# Patient Record
Sex: Male | Born: 1945 | Race: White | Hispanic: No | Marital: Married | Smoking: Former smoker
Health system: Southern US, Community
[De-identification: ages and names within clinical notes are randomized; demographics above are authoritative.]

## PROBLEM LIST (undated history)

## (undated) DIAGNOSIS — J449 Chronic obstructive pulmonary disease, unspecified: Secondary | ICD-10-CM

## (undated) DIAGNOSIS — E119 Type 2 diabetes mellitus without complications: Secondary | ICD-10-CM

## (undated) DIAGNOSIS — E785 Hyperlipidemia, unspecified: Secondary | ICD-10-CM

## (undated) DIAGNOSIS — I2510 Atherosclerotic heart disease of native coronary artery without angina pectoris: Secondary | ICD-10-CM

## (undated) DIAGNOSIS — I10 Essential (primary) hypertension: Secondary | ICD-10-CM

## (undated) DIAGNOSIS — M1990 Unspecified osteoarthritis, unspecified site: Secondary | ICD-10-CM

## (undated) DIAGNOSIS — N189 Chronic kidney disease, unspecified: Secondary | ICD-10-CM

## (undated) DIAGNOSIS — I219 Acute myocardial infarction, unspecified: Secondary | ICD-10-CM

## (undated) DIAGNOSIS — I509 Heart failure, unspecified: Secondary | ICD-10-CM

## (undated) DIAGNOSIS — I739 Peripheral vascular disease, unspecified: Secondary | ICD-10-CM

## (undated) HISTORY — DX: Chronic kidney disease, unspecified: N18.9

## (undated) HISTORY — DX: Essential (primary) hypertension: I10

## (undated) HISTORY — DX: Acute myocardial infarction, unspecified: I21.9

## (undated) HISTORY — DX: Hyperlipidemia, unspecified: E78.5

## (undated) HISTORY — DX: Chronic obstructive pulmonary disease, unspecified: J44.9

## (undated) HISTORY — DX: Heart failure, unspecified: I50.9

## (undated) HISTORY — DX: Peripheral vascular disease, unspecified: I73.9

## (undated) HISTORY — DX: Atherosclerotic heart disease of native coronary artery without angina pectoris: I25.10

## (undated) HISTORY — DX: Unspecified osteoarthritis, unspecified site: M19.90

## (undated) HISTORY — DX: Type 2 diabetes mellitus without complications: E11.9

---

## 1996-09-19 HISTORY — PX: FRACTURE SURGERY: SHX138

## 1998-09-19 DIAGNOSIS — I219 Acute myocardial infarction, unspecified: Secondary | ICD-10-CM

## 1998-09-19 HISTORY — DX: Acute myocardial infarction, unspecified: I21.9

## 2003-09-20 HISTORY — PX: CORONARY ARTERY BYPASS GRAFT: SHX141

## 2003-09-20 HISTORY — PX: JOINT REPLACEMENT: SHX530

## 2005-12-29 ENCOUNTER — Other Ambulatory Visit: Payer: Self-pay

## 2005-12-29 ENCOUNTER — Inpatient Hospital Stay: Payer: Self-pay | Admitting: Internal Medicine

## 2005-12-30 ENCOUNTER — Other Ambulatory Visit: Payer: Self-pay

## 2008-02-17 ENCOUNTER — Emergency Department: Payer: Self-pay | Admitting: Emergency Medicine

## 2009-11-13 ENCOUNTER — Emergency Department: Payer: Self-pay | Admitting: Emergency Medicine

## 2012-08-09 ENCOUNTER — Encounter: Payer: Self-pay | Admitting: Cardiovascular Disease

## 2012-08-19 ENCOUNTER — Encounter: Payer: Self-pay | Admitting: Cardiovascular Disease

## 2012-09-19 ENCOUNTER — Encounter: Payer: Self-pay | Admitting: Cardiovascular Disease

## 2012-10-20 ENCOUNTER — Encounter: Payer: Self-pay | Admitting: Cardiovascular Disease

## 2012-11-17 ENCOUNTER — Encounter: Payer: Self-pay | Admitting: Cardiovascular Disease

## 2012-12-18 ENCOUNTER — Encounter: Payer: Self-pay | Admitting: Cardiovascular Disease

## 2014-11-18 HISTORY — PX: CARDIAC DEFIBRILLATOR PLACEMENT: SHX171

## 2015-02-09 ENCOUNTER — Ambulatory Visit: Payer: Self-pay | Admitting: *Deleted

## 2015-02-18 ENCOUNTER — Ambulatory Visit: Payer: Self-pay | Admitting: *Deleted

## 2015-02-23 ENCOUNTER — Ambulatory Visit: Payer: Self-pay | Admitting: *Deleted

## 2015-03-13 ENCOUNTER — Encounter: Payer: Medicare Other | Attending: Family Medicine | Admitting: *Deleted

## 2015-03-13 ENCOUNTER — Encounter: Payer: Self-pay | Admitting: *Deleted

## 2015-03-13 VITALS — BP 150/80 | Ht 72.0 in | Wt 304.1 lb

## 2015-03-13 DIAGNOSIS — E119 Type 2 diabetes mellitus without complications: Secondary | ICD-10-CM | POA: Diagnosis present

## 2015-03-13 DIAGNOSIS — E1122 Type 2 diabetes mellitus with diabetic chronic kidney disease: Secondary | ICD-10-CM

## 2015-03-13 NOTE — Patient Instructions (Signed)
Check blood sugars 2 x day before breakfast and 2 hrs after supper every day  Eat 3 meals day,  1-2 snacks a day Space meals 4-6 hours apart Limit desserts/sweets and foods high in fat  Bring blood sugar records to the next appointment/class  Carry fast acting glucose and a snack at all times  Return for appointment/classes on:

## 2015-03-13 NOTE — Progress Notes (Signed)
Diabetes Self-Management Education  Visit Type: First/Initial  Appt. Start Time: 0915 Appt. End Time: 1030  03/13/2015  Mr. Brent Silva, identified by name and date of birth, is a 69 y.o. male with a diagnosis of Diabetes: Type 2.    ASSESSMENT Blood pressure 150/80, height 6' (1.829 m), weight 304 lb 1.6 oz (137.939 kg). Body mass index is 41.23 kg/(m^2).  Initial Visit Information: Are you currently following a meal plan?: No Are you taking your medications as prescribed?: Yes Are you checking your feet?: Yes How many days per week are you checking your feet?: 7 How often do you need to have someone help you when you read instructions, pamphlets, or other written materials from your doctor or pharmacy?: 1 - Never What is the last grade level you completed in school?: 12th  Psychosocial: Patient Belief/Attitude about Diabetes: Other ("I don't dwell on it") Self-care barriers: None Self-management support: Family, Doctor's office Patient Concerns: Medication, Monitoring, Healthy Lifestyle, Problem Solving, Glycemic Control, Weight Control Special Needs: None Preferred Learning Style: Visual, Hands on Learning Readiness: Contemplating ("maybe" when asked about making changes)  Complications:  Last HgB A1C per patient/outside source: 8.5 % (01/21/15) How often do you check your blood sugar?: 1-2 times/day Fasting Blood glucose range (mg/dL): >643 (ranges from 329-518 mg/dL Postprandial Blood glucose range (mg/dL): >841 (bedtime readings range from 238-413 mg/dL) Higher readings have occurred since switching from 70/30 to Lantus.  Have you had a dilated eye exam in the past 12 months?: Yes Have you had a dental exam in the past 12 months?: No (longer than 5 yrs ago)  Diet Intake: Breakfast: eggs, sausage or bacon, toast, fruit Snack (morning): fruit Lunch: fries, pickles, banana sandwich Dinner: meat, potatoes, green beans, salad Snack (evening): ice cream Beverage(s): water,  diet soda, coffee  Exercise: Exercise: ADL's  Individualized Plan for Diabetes Self-Management Training:  Learning Objective:  Patient will have a greater understanding of diabetes self-management.  Education Topics Reviewed with Patient Today: Explored patient's options for treatment of their diabetes Reviewed blood glucose goals for pre and post meals and how to evaluate the patients' food intake on their blood glucose level., Meal timing in regards to the patients' current diabetes medication. Role of exercise on diabetes management, blood pressure control and cardiac health. Taught/reviewed insulin injection, site rotation, insulin storage and needle disposal., Reviewed patients medication for diabetes, action, purpose, timing of dose and side effects. Identified appropriate SMBG and/or A1C goals. Taught treatment of hypoglycemia - the 15 rule. Relationship between chronic complications and blood glucose control, Identified and discussed with patient  current chronic complications  PATIENTS GOALS/Plan (Developed by the patient): Improve blood sugars Decrease medications Prevent diabetes complications Lose weight Lead a healthier lifestyle Become more fit  Plan:   Patient Instructions  Check blood sugars 2 x day before breakfast and 2 hrs after supper every day Eat 3 meals day,  1-2 snacks a day Space meals 4-6 hours apart Limit desserts/sweets and foods high in fat Bring blood sugar records to the next appointment/class Carry fast acting glucose and a snack at all times  Expected Outcomes:  Other (Demonstrated some interest in learning but not ready for many changes.)  Education material provided:  General Meal Planning Guidelines Symptoms, causes and treatments of Hypoglycemia  If problems or questions, patient to contact team via:   Sharion Settler, RN, CCM, CDE [phone]  Future DSME appointment:  March 30, 2015 for Class 1

## 2015-03-30 ENCOUNTER — Encounter: Payer: Medicare Other | Attending: Family Medicine | Admitting: Dietician

## 2015-03-30 VITALS — Ht 72.0 in | Wt 302.8 lb

## 2015-03-30 DIAGNOSIS — E119 Type 2 diabetes mellitus without complications: Secondary | ICD-10-CM | POA: Diagnosis not present

## 2015-03-30 NOTE — Progress Notes (Signed)

## 2015-04-06 ENCOUNTER — Encounter: Payer: Medicare Other | Admitting: Dietician

## 2015-04-06 ENCOUNTER — Encounter: Payer: Self-pay | Admitting: Dietician

## 2015-04-06 VITALS — Wt 301.5 lb

## 2015-04-06 DIAGNOSIS — E119 Type 2 diabetes mellitus without complications: Secondary | ICD-10-CM

## 2015-04-06 NOTE — Progress Notes (Signed)

## 2015-04-13 ENCOUNTER — Encounter: Payer: Self-pay | Admitting: Dietician

## 2015-04-13 NOTE — Progress Notes (Signed)
Pt did not come to class 3 on 04-13-15. Called pt-pt states"I forgot". Rescheduled class 3 on 05-11-15

## 2015-05-22 ENCOUNTER — Encounter: Payer: Self-pay | Admitting: *Deleted

## 2016-08-30 ENCOUNTER — Inpatient Hospital Stay
Admission: EM | Admit: 2016-08-30 | Discharge: 2016-09-04 | DRG: 871 | Disposition: A | Discharge status: Home / Self Care | Payer: Medicare Other | Attending: Specialist | Admitting: Specialist

## 2016-08-30 DIAGNOSIS — J449 Chronic obstructive pulmonary disease, unspecified: Secondary | ICD-10-CM | POA: Diagnosis present

## 2016-08-30 DIAGNOSIS — E162 Hypoglycemia, unspecified: Secondary | ICD-10-CM | POA: Diagnosis present

## 2016-08-30 DIAGNOSIS — Z882 Allergy status to sulfonamides status: Secondary | ICD-10-CM

## 2016-08-30 DIAGNOSIS — R6521 Severe sepsis with septic shock: Secondary | ICD-10-CM | POA: Diagnosis present

## 2016-08-30 DIAGNOSIS — I2510 Atherosclerotic heart disease of native coronary artery without angina pectoris: Secondary | ICD-10-CM | POA: Diagnosis present

## 2016-08-30 DIAGNOSIS — R2681 Unsteadiness on feet: Secondary | ICD-10-CM

## 2016-08-30 DIAGNOSIS — Z79899 Other long term (current) drug therapy: Secondary | ICD-10-CM

## 2016-08-30 DIAGNOSIS — E785 Hyperlipidemia, unspecified: Secondary | ICD-10-CM | POA: Diagnosis present

## 2016-08-30 DIAGNOSIS — R0600 Dyspnea, unspecified: Secondary | ICD-10-CM

## 2016-08-30 DIAGNOSIS — Z452 Encounter for adjustment and management of vascular access device: Secondary | ICD-10-CM

## 2016-08-30 DIAGNOSIS — E119 Type 2 diabetes mellitus without complications: Secondary | ICD-10-CM

## 2016-08-30 DIAGNOSIS — R262 Difficulty in walking, not elsewhere classified: Secondary | ICD-10-CM

## 2016-08-30 DIAGNOSIS — A4151 Sepsis due to Escherichia coli [E. coli]: Secondary | ICD-10-CM | POA: Diagnosis not present

## 2016-08-30 DIAGNOSIS — I252 Old myocardial infarction: Secondary | ICD-10-CM

## 2016-08-30 DIAGNOSIS — Z95810 Presence of automatic (implantable) cardiac defibrillator: Secondary | ICD-10-CM

## 2016-08-30 DIAGNOSIS — E1140 Type 2 diabetes mellitus with diabetic neuropathy, unspecified: Secondary | ICD-10-CM | POA: Diagnosis present

## 2016-08-30 DIAGNOSIS — D65 Disseminated intravascular coagulation [defibrination syndrome]: Secondary | ICD-10-CM | POA: Diagnosis not present

## 2016-08-30 DIAGNOSIS — I130 Hypertensive heart and chronic kidney disease with heart failure and stage 1 through stage 4 chronic kidney disease, or unspecified chronic kidney disease: Secondary | ICD-10-CM | POA: Diagnosis present

## 2016-08-30 DIAGNOSIS — Z888 Allergy status to other drugs, medicaments and biological substances status: Secondary | ICD-10-CM

## 2016-08-30 DIAGNOSIS — E1151 Type 2 diabetes mellitus with diabetic peripheral angiopathy without gangrene: Secondary | ICD-10-CM | POA: Diagnosis present

## 2016-08-30 DIAGNOSIS — K8309 Other cholangitis: Secondary | ICD-10-CM

## 2016-08-30 DIAGNOSIS — N184 Chronic kidney disease, stage 4 (severe): Secondary | ICD-10-CM | POA: Diagnosis present

## 2016-08-30 DIAGNOSIS — Z7951 Long term (current) use of inhaled steroids: Secondary | ICD-10-CM

## 2016-08-30 DIAGNOSIS — A419 Sepsis, unspecified organism: Secondary | ICD-10-CM

## 2016-08-30 DIAGNOSIS — E669 Obesity, unspecified: Secondary | ICD-10-CM | POA: Diagnosis present

## 2016-08-30 DIAGNOSIS — E872 Acidosis: Secondary | ICD-10-CM | POA: Diagnosis present

## 2016-08-30 DIAGNOSIS — K830 Cholangitis: Secondary | ICD-10-CM | POA: Diagnosis present

## 2016-08-30 DIAGNOSIS — R945 Abnormal results of liver function studies: Secondary | ICD-10-CM | POA: Diagnosis present

## 2016-08-30 DIAGNOSIS — Z794 Long term (current) use of insulin: Secondary | ICD-10-CM

## 2016-08-30 DIAGNOSIS — R109 Unspecified abdominal pain: Secondary | ICD-10-CM

## 2016-08-30 DIAGNOSIS — N2889 Other specified disorders of kidney and ureter: Secondary | ICD-10-CM | POA: Diagnosis present

## 2016-08-30 DIAGNOSIS — E875 Hyperkalemia: Secondary | ICD-10-CM | POA: Diagnosis present

## 2016-08-30 DIAGNOSIS — E1122 Type 2 diabetes mellitus with diabetic chronic kidney disease: Secondary | ICD-10-CM | POA: Diagnosis present

## 2016-08-30 DIAGNOSIS — R748 Abnormal levels of other serum enzymes: Secondary | ICD-10-CM | POA: Diagnosis present

## 2016-08-30 DIAGNOSIS — N200 Calculus of kidney: Secondary | ICD-10-CM | POA: Diagnosis present

## 2016-08-30 DIAGNOSIS — G9340 Encephalopathy, unspecified: Secondary | ICD-10-CM | POA: Diagnosis present

## 2016-08-30 DIAGNOSIS — Z87891 Personal history of nicotine dependence: Secondary | ICD-10-CM

## 2016-08-30 DIAGNOSIS — K859 Acute pancreatitis without necrosis or infection, unspecified: Secondary | ICD-10-CM | POA: Diagnosis present

## 2016-08-30 DIAGNOSIS — Z951 Presence of aortocoronary bypass graft: Secondary | ICD-10-CM

## 2016-08-30 DIAGNOSIS — B9620 Unspecified Escherichia coli [E. coli] as the cause of diseases classified elsewhere: Secondary | ICD-10-CM | POA: Diagnosis present

## 2016-08-30 DIAGNOSIS — K219 Gastro-esophageal reflux disease without esophagitis: Secondary | ICD-10-CM | POA: Diagnosis present

## 2016-08-30 DIAGNOSIS — Z6841 Body Mass Index (BMI) 40.0 and over, adult: Secondary | ICD-10-CM

## 2016-08-30 DIAGNOSIS — N179 Acute kidney failure, unspecified: Secondary | ICD-10-CM | POA: Diagnosis present

## 2016-08-30 DIAGNOSIS — Z7982 Long term (current) use of aspirin: Secondary | ICD-10-CM

## 2016-08-30 DIAGNOSIS — R4182 Altered mental status, unspecified: Secondary | ICD-10-CM | POA: Diagnosis not present

## 2016-08-31 ENCOUNTER — Inpatient Hospital Stay: Payer: Medicare Other

## 2016-08-31 ENCOUNTER — Emergency Department: Payer: Medicare Other

## 2016-08-31 ENCOUNTER — Encounter: Payer: Self-pay | Admitting: Internal Medicine

## 2016-08-31 DIAGNOSIS — K830 Cholangitis: Secondary | ICD-10-CM | POA: Diagnosis present

## 2016-08-31 DIAGNOSIS — N200 Calculus of kidney: Secondary | ICD-10-CM | POA: Diagnosis present

## 2016-08-31 DIAGNOSIS — J449 Chronic obstructive pulmonary disease, unspecified: Secondary | ICD-10-CM | POA: Diagnosis present

## 2016-08-31 DIAGNOSIS — E785 Hyperlipidemia, unspecified: Secondary | ICD-10-CM | POA: Diagnosis present

## 2016-08-31 DIAGNOSIS — R6521 Severe sepsis with septic shock: Secondary | ICD-10-CM | POA: Diagnosis present

## 2016-08-31 DIAGNOSIS — I2510 Atherosclerotic heart disease of native coronary artery without angina pectoris: Secondary | ICD-10-CM | POA: Diagnosis present

## 2016-08-31 DIAGNOSIS — E875 Hyperkalemia: Secondary | ICD-10-CM | POA: Diagnosis present

## 2016-08-31 DIAGNOSIS — K859 Acute pancreatitis without necrosis or infection, unspecified: Secondary | ICD-10-CM | POA: Diagnosis present

## 2016-08-31 DIAGNOSIS — R599 Enlarged lymph nodes, unspecified: Secondary | ICD-10-CM

## 2016-08-31 DIAGNOSIS — R571 Hypovolemic shock: Secondary | ICD-10-CM | POA: Diagnosis not present

## 2016-08-31 DIAGNOSIS — G9340 Encephalopathy, unspecified: Secondary | ICD-10-CM | POA: Diagnosis present

## 2016-08-31 DIAGNOSIS — R945 Abnormal results of liver function studies: Secondary | ICD-10-CM | POA: Diagnosis present

## 2016-08-31 DIAGNOSIS — A4151 Sepsis due to Escherichia coli [E. coli]: Secondary | ICD-10-CM | POA: Diagnosis present

## 2016-08-31 DIAGNOSIS — I130 Hypertensive heart and chronic kidney disease with heart failure and stage 1 through stage 4 chronic kidney disease, or unspecified chronic kidney disease: Secondary | ICD-10-CM | POA: Diagnosis present

## 2016-08-31 DIAGNOSIS — Z6841 Body Mass Index (BMI) 40.0 and over, adult: Secondary | ICD-10-CM | POA: Diagnosis not present

## 2016-08-31 DIAGNOSIS — K219 Gastro-esophageal reflux disease without esophagitis: Secondary | ICD-10-CM | POA: Diagnosis present

## 2016-08-31 DIAGNOSIS — N2889 Other specified disorders of kidney and ureter: Secondary | ICD-10-CM | POA: Diagnosis present

## 2016-08-31 DIAGNOSIS — E669 Obesity, unspecified: Secondary | ICD-10-CM | POA: Diagnosis present

## 2016-08-31 DIAGNOSIS — A419 Sepsis, unspecified organism: Secondary | ICD-10-CM

## 2016-08-31 DIAGNOSIS — R748 Abnormal levels of other serum enzymes: Secondary | ICD-10-CM | POA: Diagnosis present

## 2016-08-31 DIAGNOSIS — R4182 Altered mental status, unspecified: Secondary | ICD-10-CM | POA: Diagnosis present

## 2016-08-31 DIAGNOSIS — E1122 Type 2 diabetes mellitus with diabetic chronic kidney disease: Secondary | ICD-10-CM | POA: Diagnosis present

## 2016-08-31 DIAGNOSIS — E162 Hypoglycemia, unspecified: Secondary | ICD-10-CM | POA: Diagnosis present

## 2016-08-31 DIAGNOSIS — N184 Chronic kidney disease, stage 4 (severe): Secondary | ICD-10-CM | POA: Diagnosis present

## 2016-08-31 DIAGNOSIS — N179 Acute kidney failure, unspecified: Secondary | ICD-10-CM | POA: Diagnosis present

## 2016-08-31 DIAGNOSIS — E1151 Type 2 diabetes mellitus with diabetic peripheral angiopathy without gangrene: Secondary | ICD-10-CM | POA: Diagnosis present

## 2016-08-31 DIAGNOSIS — E872 Acidosis: Secondary | ICD-10-CM | POA: Diagnosis present

## 2016-08-31 DIAGNOSIS — D65 Disseminated intravascular coagulation [defibrination syndrome]: Secondary | ICD-10-CM | POA: Diagnosis not present

## 2016-08-31 LAB — COMPREHENSIVE METABOLIC PANEL
ALBUMIN: 3.6 g/dL (ref 3.5–5.0)
ALK PHOS: 194 U/L — AB (ref 38–126)
ALT: 69 U/L — AB (ref 17–63)
ALT: 87 U/L — AB (ref 17–63)
ALT: 87 U/L — ABNORMAL HIGH (ref 17–63)
ANION GAP: 6 (ref 5–15)
AST: 133 U/L — ABNORMAL HIGH (ref 15–41)
AST: 109 U/L — ABNORMAL HIGH (ref 15–41)
AST: 65 U/L — AB (ref 15–41)
Albumin: 4.1 g/dL (ref 3.5–5.0)
Albumin: 3.3 g/dL — ABNORMAL LOW (ref 3.5–5.0)
Alkaline Phosphatase: 232 U/L — ABNORMAL HIGH (ref 38–126)
Alkaline Phosphatase: 162 U/L — ABNORMAL HIGH (ref 38–126)
Anion gap: 7 (ref 5–15)
Anion gap: 6 (ref 5–15)
BILIRUBIN TOTAL: 3.8 mg/dL — AB (ref 0.3–1.2)
BUN: 45 mg/dL — ABNORMAL HIGH (ref 6–20)
BUN: 46 mg/dL — AB (ref 6–20)
BUN: 49 mg/dL — AB (ref 6–20)
CALCIUM: 8.1 mg/dL — AB (ref 8.9–10.3)
CHLORIDE: 105 mmol/L (ref 101–111)
CHLORIDE: 105 mmol/L (ref 101–111)
CO2: 23 mmol/L (ref 22–32)
CO2: 24 mmol/L (ref 22–32)
CO2: 26 mmol/L (ref 22–32)
CREATININE: 3.95 mg/dL — AB (ref 0.61–1.24)
Calcium: 8.9 mg/dL (ref 8.9–10.3)
Calcium: 7.8 mg/dL — ABNORMAL LOW (ref 8.9–10.3)
Chloride: 106 mmol/L (ref 101–111)
Creatinine, Ser: 3.04 mg/dL — ABNORMAL HIGH (ref 0.61–1.24)
Creatinine, Ser: 3.5 mg/dL — ABNORMAL HIGH (ref 0.61–1.24)
GFR calc Af Amer: 19 mL/min — ABNORMAL LOW (ref >60)
GFR calc Af Amer: 16 mL/min — ABNORMAL LOW (ref >60)
GFR calc non Af Amer: 19 mL/min — ABNORMAL LOW (ref >60)
GFR calc non Af Amer: 14 mL/min — ABNORMAL LOW (ref >60)
GFR, EST AFRICAN AMERICAN: 22 mL/min — AB (ref >60)
GFR, EST NON AFRICAN AMERICAN: 16 mL/min — AB (ref >60)
GLUCOSE: 108 mg/dL — AB (ref 65–99)
Glucose, Bld: 153 mg/dL — ABNORMAL HIGH (ref 65–99)
Glucose, Bld: 255 mg/dL — ABNORMAL HIGH (ref 65–99)
POTASSIUM: 5.3 mmol/L — AB (ref 3.5–5.1)
Potassium: 5.2 mmol/L — ABNORMAL HIGH (ref 3.5–5.1)
Potassium: 4.1 mmol/L (ref 3.5–5.1)
SODIUM: 135 mmol/L (ref 135–145)
SODIUM: 137 mmol/L (ref 135–145)
Sodium: 136 mmol/L (ref 135–145)
TOTAL PROTEIN: 6.4 g/dL — AB (ref 6.5–8.1)
Total Bilirubin: 3.5 mg/dL — ABNORMAL HIGH (ref 0.3–1.2)
Total Bilirubin: 2.3 mg/dL — ABNORMAL HIGH (ref 0.3–1.2)
Total Protein: 7.3 g/dL (ref 6.5–8.1)
Total Protein: 6 g/dL — ABNORMAL LOW (ref 6.5–8.1)

## 2016-08-31 LAB — URINALYSIS, COMPLETE (UACMP) WITH MICROSCOPIC
Bilirubin Urine: NEGATIVE
GLUCOSE, UA: 50 mg/dL — AB
Hgb urine dipstick: NEGATIVE
Ketones, ur: NEGATIVE mg/dL
Leukocytes, UA: NEGATIVE
NITRITE: NEGATIVE
PH: 5 (ref 5.0–8.0)
Protein, ur: 30 mg/dL — AB
Specific Gravity, Urine: 1.02 (ref 1.005–1.030)

## 2016-08-31 LAB — LIPASE, BLOOD
LIPASE: 1367 U/L — AB (ref 11–51)
Lipase: 1690 U/L — ABNORMAL HIGH (ref 11–51)

## 2016-08-31 LAB — CBC
HEMATOCRIT: 37.5 % — AB (ref 40.0–52.0)
HEMOGLOBIN: 12.8 g/dL — AB (ref 13.0–18.0)
MCH: 32.5 pg (ref 26.0–34.0)
MCHC: 34.2 g/dL (ref 32.0–36.0)
MCV: 94.9 fL (ref 80.0–100.0)
Platelets: 92 10*3/uL — ABNORMAL LOW (ref 150–440)
RBC: 3.95 MIL/uL — ABNORMAL LOW (ref 4.40–5.90)
RDW: 14.2 % (ref 11.5–14.5)
WBC: 9.4 10*3/uL (ref 3.8–10.6)

## 2016-08-31 LAB — LACTIC ACID, PLASMA
LACTIC ACID, VENOUS: 1.2 mmol/L (ref 0.5–1.9)
Lactic Acid, Venous: 1.9 mmol/L (ref 0.5–1.9)
Lactic Acid, Venous: 2 mmol/L (ref 0.5–1.9)

## 2016-08-31 LAB — BLOOD CULTURE ID PANEL (REFLEXED)
Acinetobacter baumannii: NOT DETECTED
CANDIDA KRUSEI: NOT DETECTED
CANDIDA PARAPSILOSIS: NOT DETECTED
CANDIDA TROPICALIS: NOT DETECTED
CARBAPENEM RESISTANCE: NOT DETECTED
Candida albicans: NOT DETECTED
Candida glabrata: NOT DETECTED
Enterobacter cloacae complex: NOT DETECTED
Enterobacteriaceae species: DETECTED — AB
Enterococcus species: NOT DETECTED
Escherichia coli: DETECTED — AB
Haemophilus influenzae: NOT DETECTED
KLEBSIELLA OXYTOCA: NOT DETECTED
KLEBSIELLA PNEUMONIAE: NOT DETECTED
Listeria monocytogenes: NOT DETECTED
Neisseria meningitidis: NOT DETECTED
PROTEUS SPECIES: NOT DETECTED
PSEUDOMONAS AERUGINOSA: NOT DETECTED
SERRATIA MARCESCENS: NOT DETECTED
STAPHYLOCOCCUS AUREUS BCID: NOT DETECTED
STAPHYLOCOCCUS SPECIES: NOT DETECTED
STREPTOCOCCUS PYOGENES: NOT DETECTED
Streptococcus agalactiae: NOT DETECTED
Streptococcus pneumoniae: NOT DETECTED
Streptococcus species: NOT DETECTED

## 2016-08-31 LAB — GLUCOSE, CAPILLARY
GLUCOSE-CAPILLARY: 49 mg/dL — AB (ref 65–99)
GLUCOSE-CAPILLARY: 120 mg/dL — AB (ref 65–99)
GLUCOSE-CAPILLARY: 79 mg/dL (ref 65–99)
Glucose-Capillary: 102 mg/dL — ABNORMAL HIGH (ref 65–99)
Glucose-Capillary: 137 mg/dL — ABNORMAL HIGH (ref 65–99)
Glucose-Capillary: 32 mg/dL — CL (ref 65–99)
Glucose-Capillary: 33 mg/dL — CL (ref 65–99)
Glucose-Capillary: 60 mg/dL — ABNORMAL LOW (ref 65–99)
Glucose-Capillary: 68 mg/dL (ref 65–99)
Glucose-Capillary: 76 mg/dL (ref 65–99)
Glucose-Capillary: 76 mg/dL (ref 65–99)
Glucose-Capillary: 84 mg/dL (ref 65–99)

## 2016-08-31 LAB — BASIC METABOLIC PANEL
ANION GAP: 6 (ref 5–15)
BUN: 52 mg/dL — ABNORMAL HIGH (ref 6–20)
CO2: 24 mmol/L (ref 22–32)
CREATININE: 3.99 mg/dL — AB (ref 0.61–1.24)
Calcium: 7.7 mg/dL — ABNORMAL LOW (ref 8.9–10.3)
Chloride: 105 mmol/L (ref 101–111)
GFR calc non Af Amer: 14 mL/min — ABNORMAL LOW (ref >60)
GFR, EST AFRICAN AMERICAN: 16 mL/min — AB (ref >60)
Glucose, Bld: 131 mg/dL — ABNORMAL HIGH (ref 65–99)
Potassium: 5.1 mmol/L (ref 3.5–5.1)
SODIUM: 135 mmol/L (ref 135–145)

## 2016-08-31 LAB — LIPID PANEL
CHOLESTEROL: 99 mg/dL (ref 0–200)
HDL: 17 mg/dL — AB (ref >40)
LDL Cholesterol: 59 mg/dL (ref 0–99)
TRIGLYCERIDES: 113 mg/dL (ref <150)
Total CHOL/HDL Ratio: 5.8 RATIO
VLDL: 23 mg/dL (ref 0–40)

## 2016-08-31 LAB — CBC WITH DIFFERENTIAL/PLATELET
Basophils Absolute: 0 10*3/uL (ref 0–0.1)
Basophils Relative: 0 %
Eosinophils Absolute: 0.1 10*3/uL (ref 0–0.7)
Eosinophils Relative: 1 %
HEMATOCRIT: 38 % — AB (ref 40.0–52.0)
HEMOGLOBIN: 13 g/dL (ref 13.0–18.0)
LYMPHS ABS: 0.1 10*3/uL — AB (ref 1.0–3.6)
LYMPHS PCT: 2 %
MCH: 32.3 pg (ref 26.0–34.0)
MCHC: 34.2 g/dL (ref 32.0–36.0)
MCV: 94.3 fL (ref 80.0–100.0)
MONOS PCT: 3 %
Monocytes Absolute: 0.2 10*3/uL (ref 0.2–1.0)
NEUTROS ABS: 5.8 10*3/uL (ref 1.4–6.5)
NEUTROS PCT: 94 %
Platelets: 93 10*3/uL — ABNORMAL LOW (ref 150–440)
RBC: 4.03 MIL/uL — ABNORMAL LOW (ref 4.40–5.90)
RDW: 14 % (ref 11.5–14.5)
WBC: 6.2 10*3/uL (ref 3.8–10.6)

## 2016-08-31 LAB — CK: CK TOTAL: 83 U/L (ref 49–397)

## 2016-08-31 LAB — BILIRUBIN, DIRECT: Bilirubin, Direct: 1.6 mg/dL — ABNORMAL HIGH (ref 0.1–0.5)

## 2016-08-31 LAB — MRSA PCR SCREENING: MRSA BY PCR: NEGATIVE

## 2016-08-31 LAB — PROTIME-INR
INR: 1.11
PROTHROMBIN TIME: 14.3 s (ref 11.4–15.2)

## 2016-08-31 LAB — AMMONIA: AMMONIA: 42 umol/L — AB (ref 9–35)

## 2016-08-31 MED ORDER — PIPERACILLIN-TAZOBACTAM 3.375 G IVPB
3.3750 g | Freq: Three times a day (TID) | INTRAVENOUS | Status: DC
  Administered 2016-08-31: 3.375 g via INTRAVENOUS
  Filled 2016-08-31: qty 50

## 2016-08-31 MED ORDER — DEXTROSE 50 % IV SOLN
INTRAVENOUS | Status: AC
  Administered 2016-08-31: 50 mL
  Filled 2016-08-31: qty 50

## 2016-08-31 MED ORDER — ACETAMINOPHEN 650 MG RE SUPP: 650.0000 mg | Freq: Four times a day (QID) | RECTAL | Status: DC | PRN

## 2016-08-31 MED ORDER — SODIUM CHLORIDE 0.9 % IV BOLUS (SEPSIS)
1000.0000 mL | Freq: Once | INTRAVENOUS | Status: AC
  Administered 2016-08-31: 1000 mL via INTRAVENOUS

## 2016-08-31 MED ORDER — DEXTROSE 50 % IV SOLN
50.0000 mL | Freq: Once | INTRAVENOUS | Status: AC
  Administered 2016-08-31: 50 mL via INTRAVENOUS

## 2016-08-31 MED ORDER — VANCOMYCIN HCL IN DEXTROSE 1-5 GM/200ML-% IV SOLN: 1000.0000 mg | Freq: Once | INTRAVENOUS | Status: DC

## 2016-08-31 MED ORDER — DEXTROSE 50 % IV SOLN
INTRAVENOUS | Status: AC
  Administered 2016-08-31: 50 mL via INTRAVENOUS
  Filled 2016-08-31: qty 50

## 2016-08-31 MED ORDER — DEXTROSE 50 % IV SOLN
INTRAVENOUS | Status: AC
  Administered 2016-08-31: 20:00:00
  Filled 2016-08-31: qty 50

## 2016-08-31 MED ORDER — SODIUM CHLORIDE 0.9 % IV SOLN
1500.0000 mg | INTRAVENOUS | Status: DC
  Filled 2016-08-31: qty 1500

## 2016-08-31 MED ORDER — PIPERACILLIN-TAZOBACTAM 3.375 G IVPB 30 MIN: 3.3750 g | Freq: Once | INTRAVENOUS | Status: DC

## 2016-08-31 MED ORDER — NOREPINEPHRINE 4 MG/250ML-% IV SOLN
0.0000 ug/min | INTRAVENOUS | Status: DC
  Administered 2016-08-31: 2 ug/min via INTRAVENOUS

## 2016-08-31 MED ORDER — PANTOPRAZOLE SODIUM 40 MG IV SOLR
40.0000 mg | INTRAVENOUS | Status: DC
  Administered 2016-08-31 – 2016-09-04 (×5): 40 mg via INTRAVENOUS
  Filled 2016-08-31 (×5): qty 40

## 2016-08-31 MED ORDER — SODIUM CHLORIDE 0.9 % IV BOLUS (SEPSIS)
1000.0000 mL | Freq: Once | INTRAVENOUS | Status: AC
Start: 2016-08-31 — End: 2016-08-31
  Administered 2016-08-31: 1000 mL via INTRAVENOUS

## 2016-08-31 MED ORDER — PIPERACILLIN-TAZOBACTAM 3.375 G IVPB 30 MIN
3.3750 g | Freq: Once | INTRAVENOUS | Status: AC
  Administered 2016-08-31: 3.375 g via INTRAVENOUS
  Filled 2016-08-31: qty 50

## 2016-08-31 MED ORDER — ACETAMINOPHEN 650 MG RE SUPP
975.0000 mg | Freq: Once | RECTAL | Status: AC
  Administered 2016-08-31: 02:00:00 975 mg via RECTAL
  Filled 2016-08-31: qty 1

## 2016-08-31 MED ORDER — NOREPINEPHRINE 4 MG/250ML-% IV SOLN
0.0000 ug/min | INTRAVENOUS | Status: DC
  Administered 2016-08-31 – 2016-09-01 (×2): 2 ug/min via INTRAVENOUS
  Filled 2016-08-31 (×3): qty 250

## 2016-08-31 MED ORDER — DEXTROSE 5 % IV SOLN: 0.0000 ug/min | Freq: Once | INTRAVENOUS | Status: DC

## 2016-08-31 MED ORDER — ACETAMINOPHEN 325 MG PO TABS
650.0000 mg | ORAL_TABLET | Freq: Four times a day (QID) | ORAL | Status: DC | PRN
  Administered 2016-09-01 – 2016-09-03 (×5): 650 mg via ORAL
  Filled 2016-08-31 (×5): qty 2

## 2016-08-31 MED ORDER — SODIUM CHLORIDE 0.9 % IV SOLN
INTRAVENOUS | Status: DC
  Administered 2016-08-31: 18:00:00 via INTRAVENOUS

## 2016-08-31 MED ORDER — SODIUM BICARBONATE 8.4 % IV SOLN
INTRAVENOUS | Status: DC
  Administered 2016-08-31: 14:00:00 via INTRAVENOUS
  Filled 2016-08-31 (×2): qty 150

## 2016-08-31 MED ORDER — MOMETASONE FURO-FORMOTEROL FUM 200-5 MCG/ACT IN AERO
2.0000 | INHALATION_SPRAY | Freq: Two times a day (BID) | RESPIRATORY_TRACT | Status: DC
  Administered 2016-08-31 – 2016-09-04 (×9): 2 via RESPIRATORY_TRACT
  Filled 2016-08-31: qty 8.8

## 2016-08-31 MED ORDER — DEXTROSE 50 % IV SOLN
1.0000 | Freq: Once | INTRAVENOUS | Status: AC
  Administered 2016-08-31: 50 mL via INTRAVENOUS

## 2016-08-31 MED ORDER — ONDANSETRON HCL 4 MG PO TABS: 4.0000 mg | ORAL_TABLET | Freq: Four times a day (QID) | ORAL | Status: DC | PRN

## 2016-08-31 MED ORDER — HEPARIN SODIUM (PORCINE) 5000 UNIT/ML IJ SOLN
5000.0000 [IU] | Freq: Three times a day (TID) | INTRAMUSCULAR | Status: DC
  Administered 2016-08-31: 5000 [IU] via SUBCUTANEOUS
  Filled 2016-08-31: qty 1

## 2016-08-31 MED ORDER — IOPAMIDOL (ISOVUE-300) INJECTION 61%
30.0000 mL | INTRAVENOUS | Status: AC
  Administered 2016-08-31 (×2): 30 mL via ORAL

## 2016-08-31 MED ORDER — ORAL CARE MOUTH RINSE
15.0000 mL | Freq: Two times a day (BID) | OROMUCOSAL | Status: DC
Start: 2016-08-31 — End: 2016-09-04
  Administered 2016-08-31 – 2016-09-02 (×5): 15 mL via OROMUCOSAL

## 2016-08-31 MED ORDER — ALBUTEROL SULFATE (2.5 MG/3ML) 0.083% IN NEBU: 3.0000 mL | INHALATION_SOLUTION | Freq: Four times a day (QID) | RESPIRATORY_TRACT | Status: DC | PRN

## 2016-08-31 MED ORDER — SODIUM CHLORIDE 0.9 % IV SOLN
1.0000 g | Freq: Two times a day (BID) | INTRAVENOUS | Status: DC
  Administered 2016-08-31: 1 g via INTRAVENOUS
  Filled 2016-08-31 (×3): qty 1

## 2016-08-31 MED ORDER — ONDANSETRON HCL 4 MG/2ML IJ SOLN: 4.0000 mg | Freq: Four times a day (QID) | INTRAMUSCULAR | Status: DC | PRN

## 2016-08-31 MED ORDER — IOPAMIDOL (ISOVUE-300) INJECTION 61%: 30.0000 mL | Freq: Once | INTRAVENOUS | Status: DC | PRN

## 2016-08-31 MED ORDER — NOREPINEPHRINE 4 MG/250ML-% IV SOLN
INTRAVENOUS | Status: AC
  Administered 2016-08-31: 2 ug/min via INTRAVENOUS
  Filled 2016-08-31: qty 250

## 2016-08-31 MED ORDER — SODIUM CHLORIDE 0.9 % IV SOLN
INTRAVENOUS | Status: DC
  Administered 2016-08-31: 06:00:00 via INTRAVENOUS

## 2016-08-31 MED ORDER — MEROPENEM 1 G IV SOLR
1.0000 g | Freq: Two times a day (BID) | INTRAVENOUS | Status: DC
  Administered 2016-08-31 – 2016-09-01 (×2): 1 g via INTRAVENOUS
  Filled 2016-08-31 (×3): qty 1

## 2016-08-31 MED ORDER — DEXTROSE 50 % IV SOLN
1.0000 | Freq: Once | INTRAVENOUS | Status: AC
  Administered 2016-08-31: 50 mL via INTRAVENOUS
  Filled 2016-08-31: qty 50

## 2016-08-31 MED ORDER — IBUPROFEN 600 MG PO TABS
600.0000 mg | ORAL_TABLET | Freq: Once | ORAL | Status: AC
  Administered 2016-08-31: 600 mg via ORAL
  Filled 2016-08-31: qty 1

## 2016-08-31 MED ORDER — DEXTROSE-NACL 5-0.9 % IV SOLN
INTRAVENOUS | Status: DC
  Administered 2016-08-31: 16:00:00 via INTRAVENOUS

## 2016-08-31 MED ORDER — SODIUM CHLORIDE 0.9% FLUSH
3.0000 mL | Freq: Two times a day (BID) | INTRAVENOUS | Status: DC
  Administered 2016-08-31 – 2016-09-04 (×8): 3 mL via INTRAVENOUS

## 2016-08-31 MED ORDER — NOREPINEPHRINE BITARTRATE 1 MG/ML IV SOLN: 0.0000 ug/min | INTRAVENOUS | Status: DC

## 2016-08-31 MED ORDER — DEXTROSE 10 % IV SOLN
INTRAVENOUS | Status: DC
  Administered 2016-08-31 – 2016-09-01 (×2): via INTRAVENOUS

## 2016-08-31 MED ORDER — VANCOMYCIN HCL IN DEXTROSE 1-5 GM/200ML-% IV SOLN
1000.0000 mg | Freq: Once | INTRAVENOUS | Status: AC
Start: 2016-08-31 — End: 2016-08-31
  Administered 2016-08-31: 1000 mg via INTRAVENOUS
  Filled 2016-08-31: qty 200

## 2016-08-31 MED ORDER — MORPHINE SULFATE (PF) 4 MG/ML IV SOLN: 1.0000 mg | Freq: Once | INTRAVENOUS | Status: DC

## 2016-08-31 NOTE — Consult Note (Signed)
Patient with acute pancreatitis, his second presentation with this illness.  Hx of GB removal many years ago.  He has multiple medical problems well covered by other notes in the chart.  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  Support with fluids, antibiotics, attention to renal function.  No evidence of need for ERCP at this time.  I will follow with you.  I told his wife that the patient was very sick on top of multiple medical problems.

## 2016-08-31 NOTE — H&P (Signed)
[facility] [place] [facility]   PATIENT NAME: Brent SkeansJerry Silva    MR#:  [id number]  DATE OF BIRTH:  30-Nov-1945  DATE OF ADMISSION:  08/30/2016  PRIMARY CARE PHYSICIAN: CROWDER, Pete PeltJONATHAN EARL, MD   REQUESTING/REFERRING PHYSICIAN:   CHIEF COMPLAINT:   Chief Complaint  Patient presents with  . Altered Mental Status    HISTORY OF PRESENT ILLNESS: Brent Silva  is a 70 y.o. male with a known history of Coronary artery disease, chronic kidney disease, congestive heart failure, COPD, type 2 diabetes mellitus, pancreatitis, hypertension, hyperlipidemia, peripheral vascular disease presented to the emergency room with confusion and abdominal discomfort. Patient works part-time and he went to work yesterday and when he came back he was disoriented and confused. He had abdominal discomfort in the epigastric area aching in nature 4 out of 10 on a scale of 1-10. Patient also had fever at home and at presentation the emergency room his fever was 104F. Blood pressure was also on the lower end. Patient received 2 L of IV fluids code sepsis was called. During the the workup in the emergency room his lipase level is elevated. Patient has history of cholecystectomy in the past. No history of any recent travel or sick contacts at home. After IV fluids were given and IV antibiotics were given patient is more alert and awake and responding to vocal commands. No complaints of any chest pain, shortness of breath. Patient was worked up with CT head which showed no acute intracranial abnormality. Hospitalist service was consulted for further care of the patient.  PAST MEDICAL HISTORY:   Past Medical History:  Diagnosis Date  . Arthritis   . CAD (coronary artery disease)   . CHF (congestive heart failure) (HCC)   . Chronic kidney disease   . COPD (chronic obstructive pulmonary disease) (HCC)   . Diabetes mellitus without complication (HCC)   . Hyperlipidemia   . Hypertension   . Myocardial  infarction 2000  . PVD (peripheral vascular disease) (HCC)     PAST SURGICAL HISTORY: Past Surgical History:  Procedure Laterality Date  . CARDIAC DEFIBRILLATOR PLACEMENT  11/2014  . CORONARY ARTERY BYPASS GRAFT  2005  . FRACTURE SURGERY Left 1998   Screws in andkle  . JOINT REPLACEMENT Right 2005    SOCIAL HISTORY:  Social History  Substance Use Topics  . Smoking status: Former Smoker    Packs/day: 2.00    Years: 25.00    Types: Cigarettes    Quit date: 09/27/2003  . Smokeless tobacco: Never Used  . Alcohol use No    FAMILY HISTORY:  Family History  Problem Relation Age of Onset  . Diabetes Brother   . Heart disease Brother   . Heart disease Father     DRUG ALLERGIES:  Allergies  Allergen Reactions  . Sulfa Antibiotics Other (See Comments)    hyperkalemia  . Other Other (See Comments)    Agents that cause hyperkalemia  . Umeclidinium Other (See Comments)    Chest congestion  . Clarithromycin Hives  . Ramipril Cough  . Tiotropium Other (See Comments)    Chest congestion    REVIEW OF SYSTEMS:   CONSTITUTIONAL: Has fever, fatigue, weakness.  EYES: No blurred or double vision.  Has jaundice EARS, NOSE, AND THROAT: No tinnitus or ear pain.  RESPIRATORY: No cough, shortness of breath, wheezing or hemoptysis.  CARDIOVASCULAR: No chest pain, orthopnea, edema.  GASTROINTESTINAL: No nausea, vomiting, diarrhea  Has abdominal pain.  GENITOURINARY: No dysuria,  hematuria.  ENDOCRINE: No polyuria, nocturia,  HEMATOLOGY: No anemia, easy bruising or bleeding SKIN: No rash or lesion. MUSCULOSKELETAL: No joint pain or arthritis.   NEUROLOGIC: No tingling, numbness, weakness.  PSYCHIATRY: No anxiety or depression.   MEDICATIONS AT HOME:  Prior to Admission medications   Medication Sig Start Date End Date Taking? Authorizing Provider  acetaminophen (TYLENOL) 500 MG tablet Take 500 mg by mouth every 6 (six) hours as needed.   Yes Historical Provider, MD  albuterol  (VENTOLIN HFA) 108 (90 BASE) MCG/ACT inhaler Inhale 2 puffs into the lungs every 6 (six) hours as needed for wheezing.  02/20/15 08/31/16 Yes Historical Provider, MD  aspirin 81 MG tablet Take 81 mg by mouth daily.  11/13/09  Yes Historical Provider, MD  atorvastatin (LIPITOR) 40 MG tablet Take 40 mg by mouth at bedtime.  07/17/14 08/31/16 Yes Historical Provider, MD  doxazosin (CARDURA) 2 MG tablet Take 2 mg by mouth daily.  07/17/14  Yes Historical Provider, MD  Fluticasone-Salmeterol (ADVAIR DISKUS) 500-50 MCG/DOSE AEPB Inhale 1 puff into the lungs 2 (two) times daily.  02/23/15 08/31/16 Yes Historical Provider, MD  glipiZIDE (GLUCOTROL) 10 MG tablet Take 20 mg by mouth 2 (two) times daily before a meal.  10/15/14 08/31/16 Yes Historical Provider, MD  hydrALAZINE (APRESOLINE) 25 MG tablet Take 25 mg by mouth 3 (three) times daily.   Yes Historical Provider, MD  Insulin Glargine (LANTUS SOLOSTAR) 100 UNIT/ML Solostar Pen Inject 70 Units into the skin at bedtime.  02/26/15 08/31/16 Yes Historical Provider, MD  isosorbide dinitrate (ISORDIL) 20 MG tablet Take 20 mg by mouth 3 (three) times daily.   Yes Historical Provider, MD  loratadine (CLARITIN) 10 MG tablet Take 10 mg by mouth daily as needed.  01/25/10  Yes Historical Provider, MD  metoprolol succinate (TOPROL-XL) 100 MG 24 hr tablet Take 100 mg by mouth daily.  12/26/14 08/31/16 Yes Historical Provider, MD  nitroGLYCERIN (NITROSTAT) 0.4 MG SL tablet Place 0.4 mg under the tongue every 5 (five) minutes as needed for chest pain.    Yes Historical Provider, MD  omeprazole (PRILOSEC) 20 MG capsule Take 20 mg by mouth 2 (two) times daily before a meal.  07/17/14 08/31/16 Yes Historical Provider, MD  Polyethylene Glycol 3350 POWD Take 1 Dose by mouth daily as needed.  06/28/14  Yes Historical Provider, MD  pregabalin (LYRICA) 75 MG capsule One po QD for one week, then one po Bid for one week, then one po tid 02/12/15  Yes Historical Provider, MD  ranolazine  (RANEXA) 1000 MG SR tablet Take 1,000 mg by mouth 2 (two) times daily.  02/20/15 08/31/16 Yes Historical Provider, MD  tamsulosin (FLOMAX) 0.4 MG CAPS capsule Take 0.4 mg by mouth daily.  10/14/14  Yes Historical Provider, MD  torsemide (DEMADEX) 20 MG tablet Take 20 mg by mouth daily.   Yes Historical Provider, MD  losartan (COZAAR) 25 MG tablet Take 12.5 mg by mouth daily.  12/26/14 12/26/15  Historical Provider, MD  nitroGLYCERIN (NITRODUR - DOSED IN MG/24 HR) 0.4 mg/hr patch Place onto the skin. 03/28/14 03/28/15  Historical Provider, MD      PHYSICAL EXAMINATION:   VITAL SIGNS: Blood pressure (!) 168/129, pulse 69, temperature (!) 104.2 F (40.1 C), temperature source Rectal, resp. rate 18, height 5\' 11"  (1.803 m), weight (!) 139.7 kg (308 lb), SpO2 93 %.  GENERAL:  70 y.o.-year-old patient lying in the bed with fever and jaundice.  EYES: Pupils equal, round, reactive to light and  accommodation. Has scleral icterus. Extraocular muscles intact.  HEENT: Head atraumatic, normocephalic. Oropharynx and nasopharynx clear.  NECK:  Supple, no jugular venous distention. No thyroid enlargement, no tenderness.  LUNGS: Normal breath sounds bilaterally, no wheezing, rales,rhonchi or crepitation. No use of accessory muscles of respiration.  CARDIOVASCULAR: S1, S2 normal. No murmurs, rubs, or gallops.  ABDOMEN: Soft, tenderness in epigastrium noted, nondistended. Bowel sounds decreased. No organomegaly or mass.  EXTREMITIES: No pedal edema, cyanosis, or clubbing.  NEUROLOGIC: Cranial nerves II through XII are intact. Muscle strength 5/5 in all extremities. Sensation intact. Gait not checked.  PSYCHIATRIC: The patient is alert and oriented x 3.  SKIN: No obvious rash, lesion, or ulcer.   LABORATORY PANEL:   CBC  Recent Labs Lab 08/31/16 0016  WBC 6.2  HGB 13.0  HCT 38.0*  PLT 93*  MCV 94.3  MCH 32.3  MCHC 34.2  RDW 14.0  LYMPHSABS 0.1*  MONOABS 0.2  EOSABS 0.1  BASOSABS 0.0    ------------------------------------------------------------------------------------------------------------------  Chemistries   Recent Labs Lab 08/31/16 0016  NA 137  K 5.2*  CL 105  CO2 26  GLUCOSE 153*  BUN 45*  CREATININE 3.04*  CALCIUM 8.9  AST 133*  ALT 87*  ALKPHOS 232*  BILITOT 3.5*   ------------------------------------------------------------------------------------------------------------------ estimated creatinine clearance is 31.4 mL/min (by C-G formula based on SCr of 3.04 mg/dL (H)). ------------------------------------------------------------------------------------------------------------------ No results for input(s): TSH, T4TOTAL, T3FREE, THYROIDAB in the last 72 hours.  Invalid input(s): FREET3   Coagulation profile No results for input(s): INR, PROTIME in the last 168 hours. ------------------------------------------------------------------------------------------------------------------- No results for input(s): DDIMER in the last 72 hours. -------------------------------------------------------------------------------------------------------------------  Cardiac Enzymes No results for input(s): CKMB, TROPONINI, MYOGLOBIN in the last 168 hours.  Invalid input(s): CK ------------------------------------------------------------------------------------------------------------------ Invalid input(s): POCBNP  ---------------------------------------------------------------------------------------------------------------  Urinalysis No results found for: COLORURINE, APPEARANCEUR, LABSPEC, PHURINE, GLUCOSEU, HGBUR, BILIRUBINUR, KETONESUR, PROTEINUR, UROBILINOGEN, NITRITE, LEUKOCYTESUR   RADIOLOGY: Ct Head Wo Contrast  Result Date: 08/31/2016 CLINICAL DATA:  70 y/o  M; altered mental status. EXAM: CT HEAD WITHOUT CONTRAST TECHNIQUE: Contiguous axial images were obtained from the base of the skull through the vertex without intravenous contrast.  COMPARISON:  12/29/2005 CT head FINDINGS: Brain: No evidence of acute infarction, hemorrhage, hydrocephalus, extra-axial collection or mass lesion/mass effect. Mild chronic microvascular ischemic changes and parenchymal volume loss of the brain are progressed from the prior CT of the head. Lucency within the left hemi pons is compatible with an age-indeterminate lacunar infarct (series 2, image 11). Vascular: Calcific atherosclerosis of the cavernous internal carotid arteries and the vertebrobasilar system. Skull: Normal. Negative for fracture or focal lesion. Sinuses/Orbits: No acute finding. Other: None. IMPRESSION: 1. No evidence of large acute infarct, intracranial hemorrhage, or focal mass effect. 2. Mild chronic microvascular ischemic changes and parenchymal volume loss of the brain are progressed from 2007. 3. Left hemi pons lucency compatible with age-indeterminate lacunar infarct. Electronically Signed   By: Mitzi Hansen M.D.   On: 08/31/2016 01:26   Dg Chest Portable 1 View  Result Date: 08/31/2016 CLINICAL DATA:  70 y/o  M; dyspnea and fever. EXAM: PORTABLE CHEST 1 VIEW COMPARISON:  11/13/2009 chest radiograph FINDINGS: Stable enlarged cardiac silhouette. Two lead AICD. Stable obscuration of left heart border possibly representing postsurgical changes and/or a prominent epicardial fat pad. Moderate bronchitic markings. No focal consolidation. Post median sternotomy with multiple broken wires similar to prior study. IMPRESSION: Moderate bronchitic markings may represent bronchitis. No new focal consolidation. Electronically Signed   By: Buzzy Han.D.  On: 08/31/2016 02:49    EKG: Orders placed or performed during the hospital encounter of 08/30/16  . EKG 12-Lead  . EKG 12-Lead  . ED EKG 12-Lead  . ED EKG 12-Lead    IMPRESSION AND PLAN: 70 year old male patient with history of coronary artery disease, congestive heart failure, chronic kidney disease, type 2  diabetes and his, hypertension, hyperlipidemia presented to the emergency room with abdominal pain, fever and low blood pressure. Admitting diagnosis 1. Sepsis 2. Acute pancreatitis 3. Acute cholangitis 4. Abnormal liver function tests 5. Acute on chronic renal failure 6. Hypotension Treatment plan Admit patient to stepdown unit IV fluid hydration Keep patient nothing by mouth and follow-up lipase level Follow-up liver function tests Gastroenteritis consultation Start patient on IV vancomycin and IV Zosyn antibiotics Follow-up renal function Check abdominal ultrasound  All the records are reviewed and case discussed with ED provider. Management plans discussed with the patient, family and they are in agreement.  CODE STATUS:FULL CODE Surrogate decision maker : Joyce Gross Ellerbe {spouse ] Code Status History    This patient does not have a recorded code status. Please follow your organizational policy for patients in this situation.       TOTAL CRITICAL CARE TIME TAKING CARE OF THIS PATIENT: 58 minutes.    Ihor Austin M.D on 08/31/2016 at 3:39 AM  Between 7am to 6pm - Pager - [phone]  After 6pm go to [email or web address] - password [email or web address] [facility]  [facility] [place]  Office  [phone]  CC: Primary care physician; CROWDER, Pete Pelt, MD

## 2016-08-31 NOTE — Progress Notes (Signed)
Pharmacy Antibiotic Note  Brent Silva is a 70 y.o. male admitted on 08/30/2016 with sepsis.  Pharmacy has been consulted for Vancomycin and Zosyn dosing.  Plan: Vancomycin 1500 IV every 36 hours.  Goal trough 15-20 mcg/mL. Zosyn 3.375g IV q8h (4 hour infusion). Will check a Vancomycin trough level prior to 4th which will not be at steady state. Will need to watch for accummulation.  Height: 5\' 11"  (180.3 cm) Weight: (!) 308 lb (139.7 kg) IBW/kg (Calculated) : 75.3  Temp (24hrs), Avg:101.3 F (38.5 C), Min:98.1 F (36.7 C), Max:104.2 F (40.1 C)   Recent Labs Lab 08/31/16 0016  WBC 6.2  CREATININE 3.04*  LATICACIDVEN 1.9    Estimated Creatinine Clearance: 31.4 mL/min (by C-G formula based on SCr of 3.04 mg/dL (H)).    Allergies  Allergen Reactions  . Sulfa Antibiotics Other (See Comments)    hyperkalemia  . Other Other (See Comments)    Agents that cause hyperkalemia  . Umeclidinium Other (See Comments)    Chest congestion  . Clarithromycin Hives  . Ramipril Cough  . Tiotropium Other (See Comments)    Chest congestion    Antimicrobials this admission: Vancomyin 12/13 >>  Zosyn 12/13 >>   Dose adjustments this admission:  Microbiology results:   Thank you for allowing pharmacy to be a part of this patient's care.  Clovia CuffLisa Rocsi Hazelbaker, PharmD, BCPS 08/31/2016 4:07 AM

## 2016-08-31 NOTE — Progress Notes (Signed)
PHARMACY - PHYSICIAN COMMUNICATION CRITICAL VALUE ALERT - BLOOD CULTURE IDENTIFICATION (BCID)  Results for orders placed or performed during the hospital encounter of 08/30/16  Blood Culture ID Panel (Reflexed) (Collected: 08/31/2016 12:16 AM)  Result Value Ref Range   Enterococcus species NOT DETECTED NOT DETECTED   Listeria monocytogenes NOT DETECTED NOT DETECTED   Staphylococcus species NOT DETECTED NOT DETECTED   Staphylococcus aureus NOT DETECTED NOT DETECTED   Streptococcus species NOT DETECTED NOT DETECTED   Streptococcus agalactiae NOT DETECTED NOT DETECTED   Streptococcus pneumoniae NOT DETECTED NOT DETECTED   Streptococcus pyogenes NOT DETECTED NOT DETECTED   Acinetobacter baumannii NOT DETECTED NOT DETECTED   Enterobacteriaceae species DETECTED (A) NOT DETECTED   Enterobacter cloacae complex NOT DETECTED NOT DETECTED   Escherichia coli DETECTED (A) NOT DETECTED   Klebsiella oxytoca NOT DETECTED NOT DETECTED   Klebsiella pneumoniae NOT DETECTED NOT DETECTED   Proteus species NOT DETECTED NOT DETECTED   Serratia marcescens NOT DETECTED NOT DETECTED   Carbapenem resistance NOT DETECTED NOT DETECTED   Haemophilus influenzae NOT DETECTED NOT DETECTED   Neisseria meningitidis NOT DETECTED NOT DETECTED   Pseudomonas aeruginosa NOT DETECTED NOT DETECTED   Candida albicans NOT DETECTED NOT DETECTED   Candida glabrata NOT DETECTED NOT DETECTED   Candida krusei NOT DETECTED NOT DETECTED   Candida parapsilosis NOT DETECTED NOT DETECTED   Candida tropicalis NOT DETECTED NOT DETECTED    Name of physician (or Provider) Contacted: [place]  Changes to prescribed antibiotics required: Meropenem   Gardner CandleSheema M Tarren Sabree, PharmD, BCPS Clinical Pharmacist  08/31/2016  12:19 PM

## 2016-08-31 NOTE — Consult Note (Signed)
GI Inpatient Consult Note  Reason for Consult: Acute pancreatitis   Attending Requesting Consult: Dr. Leroy Libman  History of Present Illness: Brent Silva is a 70 y.o. male with a known history of MI (2000), CHF, CAD, PVD, HTN, HLD, COPD, CKD, DM II, and arthritis admitted with acute pancreatitis.  Patient presented to the [facility] ED on 08/31/16 via EMS for progressive confusion x 1 day and epigastric discomfort.  Patient's wife patient was in his usual state of health the day before, he came home from work today disoriented and confused.  She stated he knew his name, but not his age or current year.  Patient also noted an aching discomfort in the epigastric area, rating a 4/10 in severity.  No fever was noted at home (unsure of exact temperature).  Given the symptoms, patient's wife called EMS for transport to the ED.  Upon arrival, patient was febrile at 104F and slightly hypotensive with BP 84/50.  Code sepsis was called.  Minimal labs include lipase 1,690, ammonia 42, AST 133, ALT 87, alk phos 232, T bili 3.5, BUN 45, and creatinine 3.04.  CBC, UA, lactic acid, and CK returned normal.  MRSA screen also returned negative.  Blood cultures are pending, with no growth noted after 12 hours.  CT head was negative for acute intracranial abnormalities.  Patient was admitted for further management, including aggressive hydration with IV fluids and pain control as needed.  IV antibiotics were also administered empirically in the setting of a code sepsis.  GI consultation was requested for additional management.  Notably, patient underwent cholecystectomy in the early 2000s.  He denies significant EtOH use.  Lipid profile from 07/12/16 was WNL.  Patient also denies starting any new medications or supplements in the past several weeks.  This morning, lipase has trending down to 1,367. LFTs and kidney function also improving, with AST 109, ALT 87, alkl phos 194, T bili 3.8, BUN 46, and Cr 3.50.  CT a/p w/o contrast  demonstrated mild stranding of peripancreatic fat, especially in pancraetic head and body.  US abdomen provided incomplete evaluation of the pancreas due to gas, but CBD diameter was WNL.  A hypoechoic, noncystic mass in the upper pole of the left kidney was incidentally noted on both CT and [place], with MRI recommended for further evaluation.  Past Medical History:  Past Medical History:  Diagnosis Date  . Arthritis   . CAD (coronary artery disease)   . CHF (congestive heart failure) ([place])   . Chronic kidney disease   . COPD (chronic obstructive pulmonary disease) ([place])   . Diabetes mellitus without complication ([place])   . Hyperlipidemia   . Hypertension   . Myocardial infarction 2000  . PVD (peripheral vascular disease) ([place])     Problem List: Patient Active Problem List   Diagnosis Date Noted  . Sepsis ([place]) 08/31/2016  . Acute pancreatitis 08/31/2016    Past Surgical History: Past Surgical History:  Procedure Laterality Date  . CARDIAC DEFIBRILLATOR PLACEMENT  11/2014  . CORONARY ARTERY BYPASS GRAFT  2005  . FRACTURE SURGERY Left 1998   Screws in andkle  . JOINT REPLACEMENT Right 2005    Allergies: Allergies  Allergen Reactions  . Sulfa Antibiotics Other (See Comments)    hyperkalemia  . Other Other (See Comments)    Agents that cause hyperkalemia  . Umeclidinium Other (See Comments)    Chest congestion  . Clarithromycin Hives  . Ramipril Cough  . Tiotropium Other (See Comments)  Chest congestion    Home Medications: Prescriptions Prior to Admission  Medication Sig Dispense Refill Last Dose  . acetaminophen (TYLENOL) 500 MG tablet Take 500 mg by mouth every 6 (six) hours as needed.     Marland Kitchen albuterol (VENTOLIN HFA) 108 (90 BASE) MCG/ACT inhaler Inhale 2 puffs into the lungs every 6 (six) hours as needed for wheezing.    Taking  . aspirin 81 MG tablet Take 81 mg by mouth daily.    08/30/2016 at Unknown time  . atorvastatin (LIPITOR) 40 MG tablet Take 40 mg by mouth at  bedtime.    08/30/2016 at Unknown time  . doxazosin (CARDURA) 2 MG tablet Take 2 mg by mouth daily.    08/30/2016 at Unknown time  . Fluticasone-Salmeterol (ADVAIR DISKUS) 500-50 MCG/DOSE AEPB Inhale 1 puff into the lungs 2 (two) times daily.    08/30/2016 at Unknown time  . glipiZIDE (GLUCOTROL) 10 MG tablet Take 20 mg by mouth 2 (two) times daily before a meal.    08/30/2016 at Unknown time  . hydrALAZINE (APRESOLINE) 25 MG tablet Take 25 mg by mouth 3 (three) times daily.   08/30/2016 at Unknown time  . Insulin Glargine (LANTUS SOLOSTAR) 100 UNIT/ML Solostar Pen Inject 70 Units into the skin at bedtime.    08/30/2016 at Unknown time  . isosorbide dinitrate (ISORDIL) 20 MG tablet Take 20 mg by mouth 3 (three) times daily.   08/30/2016 at Unknown time  . loratadine (CLARITIN) 10 MG tablet Take 10 mg by mouth daily as needed.    08/30/2016 at Unknown time  . metoprolol succinate (TOPROL-XL) 100 MG 24 hr tablet Take 100 mg by mouth daily.    08/30/2016 at Unknown time  . nitroGLYCERIN (NITROSTAT) 0.4 MG SL tablet Place 0.4 mg under the tongue every 5 (five) minutes as needed for chest pain.    Taking  . omeprazole (PRILOSEC) 20 MG capsule Take 20 mg by mouth 2 (two) times daily before a meal.    08/30/2016 at Unknown time  . Polyethylene Glycol 3350 POWD Take 1 Dose by mouth daily as needed.    08/30/2016 at Unknown time  . pregabalin (LYRICA) 75 MG capsule One po QD for one week, then one po Bid for one week, then one po tid   08/30/2016 at Unknown time  . ranolazine (RANEXA) 1000 MG SR tablet Take 1,000 mg by mouth 2 (two) times daily.    08/30/2016 at Unknown time  . tamsulosin (FLOMAX) 0.4 MG CAPS capsule Take 0.4 mg by mouth daily.    08/30/2016 at Unknown time  . torsemide (DEMADEX) 20 MG tablet Take 20 mg by mouth daily.   08/30/2016 at Unknown time  . losartan (COZAAR) 25 MG tablet Take 12.5 mg by mouth daily.    Taking  . nitroGLYCERIN (NITRODUR - DOSED IN MG/24 HR) 0.4 mg/hr patch Place  onto the skin.   Not Taking   Home medication reconciliation was completed with the patient.   Scheduled Inpatient Medications:   . heparin  5,000 Units Subcutaneous Q8H  . mouth rinse  15 mL Mouth Rinse BID  . mometasone-formoterol  2 puff Inhalation BID  . pantoprazole (PROTONIX) IV  40 mg Intravenous Q24H  . piperacillin-tazobactam (ZOSYN)  IV  3.375 g Intravenous Q8H  . sodium chloride flush  3 mL Intravenous Q12H    Continuous Inpatient Infusions:   . sodium chloride 125 mL/hr at 08/31/16 0611  . norepinephrine 4 mcg/min (08/31/16 0634)    PRN  Inpatient Medications:  acetaminophen **OR** acetaminophen, albuterol, ondansetron **OR** ondansetron (ZOFRAN) IV  Family History: family history includes Diabetes in his brother; Heart disease in his brother and father.    Social History:   reports that he quit smoking about 12 years ago. His smoking use included Cigarettes. He has a 50.00 pack-year smoking history. He has never used smokeless tobacco. He reports that he does not drink alcohol or use drugs.  Review of Systems: Constitutional: Weight is stable.  Eyes: No changes in vision. ENT: No oral lesions, sore throat.  GI: see HPI.  Heme/Lymph: No easy bruising.  CV: No chest pain.  GU: No hematuria.  Integumentary: No rashes.  Neuro: No headaches.  Psych: No depression/anxiety.  Endocrine: No heat/cold intolerance.  Allergic/Immunologic: No urticaria.  Resp: No cough, SOB.  Musculoskeletal: No joint swelling.    Physical Examination: BP 119/73 (BP Location: Left Arm)   Pulse 65   Temp 97.7 F (36.5 C) (Oral)   Resp 15   Ht _0  (1.803 m)   Wt (!) 140.1 kg (308 lb 13.8 oz)   SpO2 93%   BMI 43.08 kg/m  Gen: NAD, alert and oriented x 4 HEENT: PEERLA, EOMI, Neck: supple, no JVD or thyromegaly Chest: CTA bilaterally, no wheezes, crackles, or other adventitious sounds CV: RRR, no m/g/c/r Abd: soft, moderate epigastric tenderness to palpation, ND, +BS in all  four quadrants; no HSM, guarding, ridigity, or rebound tenderness Ext: no edema, well perfused with 2+ pulses, Skin: no rash or lesions noted Lymph: no LAD  Data: Lab Results  Component Value Date   WBC 9.4 08/31/2016   HGB 12.8 (L) 08/31/2016   HCT 37.5 (L) 08/31/2016   MCV 94.9 08/31/2016   PLT 92 (L) 08/31/2016    Recent Labs Lab 08/31/16 0016 08/31/16 0701  HGB 13.0 12.8*   Lab Results  Component Value Date   NA 136 08/31/2016   K 5.3 (H) 08/31/2016   CL 106 08/31/2016   CO2 23 08/31/2016   BUN 46 (H) 08/31/2016   CREATININE 3.50 (H) 08/31/2016   Lab Results  Component Value Date   ALT 87 (H) 08/31/2016   AST 109 (H) 08/31/2016   ALKPHOS 194 (H) 08/31/2016   BILITOT 3.8 (H) 08/31/2016    Recent Labs Lab 08/31/16 0701  INR 1.11   Assessment/Plan: Brent Silva is a 70 y.o. male with a known history of MI (2000), CHF, CAD, PVD, HTN, HLD, COPD, CKD, DM II, and arthritis admitted with acute pancreatitis.   Patient remains hypotensive (BP 85/62 at last check), but he is afebrile (98.3).  Lipase and LFTs are trending down with IV fluids and conservative management.  Also agree with IV Zosyn and obtaining MRCP for further evaluation of portal caval node.  Recommendations: - Continue aggressive hydration with IV fluids - Continue IV Zosyn - Continue IV pain control and antiemetics prn - Trend LFTs daily - Will continue to monitor, further recs pending MRCP results  Thank you for the consult. We will follow along with you. Please call with questions or concerns.    Lavera Guise, PA-C [facility] Phone: [phone] Pager: [phone]

## 2016-08-31 NOTE — Progress Notes (Addendum)
Inpatient Diabetes Program Recommendations  AACE/ADA: New Consensus Statement on Inpatient Glycemic Control (2015)  Target Ranges:  Prepandial:   less than 140 mg/dL      Peak postprandial:   less than 180 mg/dL (1-2 hours)      Critically ill patients:  140 - 180 mg/dL   Results for [name], Yanky R (MRN [id number]) as of 08/31/2016 11:24  Ref. Range 08/31/2016 05:52 08/31/2016 06:42 08/31/2016 07:19  Glucose-Capillary Latest Ref Range: 65 - 99 mg/dL 49 (L) 409120 (H) 811102 (H)   Review of Glycemic Control  Diabetes history: DM2 Outpatient Diabetes medications: Lantus 70 units QHS, Glipizide 20 mg BID Current orders for Inpatient glycemic control: CBGs Q4H  Inpatient Diabetes Program Recommendations: Correction (SSI): Noted patient has ICU Glycemic Control order set Phase 1 ordered with only CBG monitoring. Please order Novolog correction scale Q4H using ICU Glycemic Phase 1 order set.  NOTE: In reviewing chart, noted glucose down to 49 mg/dl at 9:145:52 today. No insulin or DM medications have been given since patient arrived at the hospital. Anticipate hypoglycemia due to outpatient DM medications. Patient is currently NPO and ordered ICU Glycemic Control Phase 1 order set with CBG monitoring only. Patient will need order for Novolog correction insulin Q4H.  Thanks, Orlando PennerMarie Kadien Lineman, RN, MSN, CDE Diabetes Coordinator Inpatient Diabetes Program [phone] (Team Pager from 8am to 5pm)

## 2016-08-31 NOTE — ED Provider Notes (Addendum)
[facility] Emergency Department Provider Note   First MD Initiated Contact with Patient 08/31/16 0003     (approximate)  I have reviewed the triage vital signs and the nursing notes.  History Limited secondary to altered mental status HISTORY  Chief Complaint Altered Mental Status   HPI Brent Silva NurseJerry R Deschler is a 70 y.o. male with below list of chronic medical conditions presents with altered status via EMS. Per EMS the patient's wife stated that he is had progressive confusion over the course of today. Patient oriented to his name however could not recall his age or the current year. Patient denies any pain at present however admits to being tremulous.   Past Medical History:  Diagnosis Date  . Arthritis   . CAD (coronary artery disease)   . CHF (congestive heart failure) ([facility])   . Chronic kidney disease   . COPD (chronic obstructive pulmonary disease) ([facility])   . Diabetes mellitus without complication ([facility])   . Hyperlipidemia   . Hypertension   . Myocardial infarction 2000  . PVD (peripheral vascular disease) [facility](HCC)     Patient Active Problem List   Diagnosis Date Noted  . Sepsis ([facility]) 08/31/2016  . Acute pancreatitis 08/31/2016    Past Surgical History:  Procedure Laterality Date  . CARDIAC DEFIBRILLATOR PLACEMENT  11/2014  . CORONARY ARTERY BYPASS GRAFT  2005  . FRACTURE SURGERY Left 1998   Screws in andkle  . JOINT REPLACEMENT Right 2005    Prior to Admission medications   Medication Sig Start Date End Date Taking? Authorizing Provider  acetaminophen (TYLENOL) 500 MG tablet Take 500 mg by mouth every 6 (six) hours as needed.   Yes Historical Provider, MD  albuterol (VENTOLIN HFA) 108 (90 BASE) MCG/ACT inhaler Inhale 2 puffs into the lungs every 6 (six) hours as needed for wheezing.  02/20/15 08/31/16 Yes Historical Provider, MD  aspirin 81 MG tablet Take 81 mg by mouth daily.  11/13/09  Yes Historical Provider, MD  atorvastatin (LIPITOR) 40 MG  tablet Take 40 mg by mouth at bedtime.  07/17/14 08/31/16 Yes Historical Provider, MD  doxazosin (CARDURA) 2 MG tablet Take 2 mg by mouth daily.  07/17/14  Yes Historical Provider, MD  Fluticasone-Salmeterol (ADVAIR DISKUS) 500-50 MCG/DOSE AEPB Inhale 1 puff into the lungs 2 (two) times daily.  02/23/15 08/31/16 Yes Historical Provider, MD  glipiZIDE (GLUCOTROL) 10 MG tablet Take 20 mg by mouth 2 (two) times daily before a meal.  10/15/14 08/31/16 Yes Historical Provider, MD  hydrALAZINE (APRESOLINE) 25 MG tablet Take 25 mg by mouth 3 (three) times daily.   Yes Historical Provider, MD  Insulin Glargine (LANTUS SOLOSTAR) 100 UNIT/ML Solostar Pen Inject 70 Units into the skin at bedtime.  02/26/15 08/31/16 Yes Historical Provider, MD  isosorbide dinitrate (ISORDIL) 20 MG tablet Take 20 mg by mouth 3 (three) times daily.   Yes Historical Provider, MD  loratadine (CLARITIN) 10 MG tablet Take 10 mg by mouth daily as needed.  01/25/10  Yes Historical Provider, MD  metoprolol succinate (TOPROL-XL) 100 MG 24 hr tablet Take 100 mg by mouth daily.  12/26/14 08/31/16 Yes Historical Provider, MD  nitroGLYCERIN (NITROSTAT) 0.4 MG SL tablet Place 0.4 mg under the tongue every 5 (five) minutes as needed for chest pain.    Yes Historical Provider, MD  omeprazole (PRILOSEC) 20 MG capsule Take 20 mg by mouth 2 (two) times daily before a meal.  07/17/14 08/31/16 Yes Historical Provider, MD  Polyethylene Glycol 3350 POWD  Take 1 Dose by mouth daily as needed.  06/28/14  Yes Historical Provider, MD  pregabalin (LYRICA) 75 MG capsule One po QD for one week, then one po Bid for one week, then one po tid 02/12/15  Yes Historical Provider, MD  ranolazine (RANEXA) 1000 MG SR tablet Take 1,000 mg by mouth 2 (two) times daily.  02/20/15 08/31/16 Yes Historical Provider, MD  tamsulosin (FLOMAX) 0.4 MG CAPS capsule Take 0.4 mg by mouth daily.  10/14/14  Yes Historical Provider, MD  torsemide (DEMADEX) 20 MG tablet Take 20 mg by mouth daily.    Yes Historical Provider, MD  losartan (COZAAR) 25 MG tablet Take 12.5 mg by mouth daily.  12/26/14 12/26/15  Historical Provider, MD  nitroGLYCERIN (NITRODUR - DOSED IN MG/24 HR) 0.4 mg/hr patch Place onto the skin. 03/28/14 03/28/15  Historical Provider, MD    Allergies Sulfa antibiotics; Other; Umeclidinium; Clarithromycin; Ramipril; and Tiotropium  Family History  Problem Relation Age of Onset  . Diabetes Brother   . Heart disease Brother   . Heart disease Father     Social History Social History  Substance Use Topics  . Smoking status: Former Smoker    Packs/day: 2.00    Years: 25.00    Types: Cigarettes    Quit date: 09/27/2003  . Smokeless tobacco: Never Used  . Alcohol use No    Review of Systems Constitutional: No fever/chills Eyes: No visual changes. ENT: No sore throat. Cardiovascular: Denies chest pain. Respiratory: Denies shortness of breath. Gastrointestinal: No abdominal pain.  No nausea, no vomiting.  No diarrhea.  No constipation. Genitourinary: Negative for dysuria. Musculoskeletal: Negative for back pain. Skin: Negative for rash. Neurological: Negative for headaches, focal weakness or numbness. Positive for confusion  10-point ROS otherwise negative.  ____________________________________________   PHYSICAL EXAM:  VITAL SIGNS: ED Triage Vitals  Enc Vitals Group     BP --      Pulse --      Resp --      Temp --      Temp src --      SpO2 08/30/16 2359 98 %     Weight 08/31/16 0003 (!) 308 lb (139.7 kg)     Height 08/31/16 0003 5\' 11"  (1.803 m)     Head Circumference --      Peak Flow --      Pain Score --      Pain Loc --      Pain Edu? --      Excl. in GC? --     Constitutional: Alert but confused Eyes: Conjunctivae are normal. PERRL. EOMI. Head: Atraumatic. Ears:  Healthy appearing ear canals and TMs bilaterally Nose: No congestion/rhinnorhea. Mouth/Throat: Mucous membranes are moist.  Oropharynx non-erythematous. Neck: No stridor.  No  meningeal signs.  No cervical spine tenderness to palpation. Cardiovascular: Normal rate, regular rhythm. Good peripheral circulation. Grossly normal heart sounds. Respiratory: Normal respiratory effort.  No retractions. Lungs CTAB. Gastrointestinal: Soft and nontender. No distention.  Musculoskeletal: No lower extremity tenderness nor edema. No gross deformities of extremities. Neurologic:  Normal speech and language. No gross focal neurologic deficits are appreciated.  Skin:  Skin is warm, dry and intact. No rash noted. Psychiatric: Mood and affect are normal. Speech and behavior are normal.  ____________________________________________   LABS (all labs ordered are listed, but only abnormal results are displayed)  Labs Reviewed  COMPREHENSIVE METABOLIC PANEL - Abnormal; Notable for the following:       Result Value  Potassium 5.2 (*)    Glucose, Bld 153 (*)    BUN 45 (*)    Creatinine, Ser 3.04 (*)    AST 133 (*)    ALT 87 (*)    Alkaline Phosphatase 232 (*)    Total Bilirubin 3.5 (*)    GFR calc non Af Amer 19 (*)    GFR calc Af Amer 22 (*)    All other components within normal limits  CBC WITH DIFFERENTIAL/PLATELET - Abnormal; Notable for the following:    RBC 4.03 (*)    HCT 38.0 (*)    Platelets 93 (*)    Lymphs Abs 0.1 (*)    All other components within normal limits  AMMONIA - Abnormal; Notable for the following:    Ammonia 42 (*)    All other components within normal limits  LIPASE, BLOOD - Abnormal; Notable for the following:    Lipase 1,690 (*)    All other components within normal limits  CULTURE, BLOOD (ROUTINE X 2)  CULTURE, BLOOD (ROUTINE X 2)  URINE CULTURE  LACTIC ACID, PLASMA  CK  LACTIC ACID, PLASMA  URINALYSIS, COMPLETE (UACMP) WITH MICROSCOPIC  LIPASE, BLOOD  LACTIC ACID, PLASMA  LACTIC ACID, PLASMA  LIPID PANEL   ____________________________________________  EKG  ED ECG REPORT I, [place] BROWN, the attending physician, personally  viewed and interpreted this ECG.   Date: 08/31/2016  EKG Time: 12:03AM  Rate: 75  Rhythm: Normal sinus rhythm  Axis: Normal  Intervals:Normal  ST&T Change: None  ____________________________________________  RADIOLOGY I, [place] Dewayne Shorter, personally viewed and evaluated these images (plain radiographs) as part of my medical decision making, as well as reviewing the written report by the radiologist.  Ct Head Wo Contrast  Result Date: 08/31/2016 CLINICAL DATA:  70 y/o  M; altered mental status. EXAM: CT HEAD WITHOUT CONTRAST TECHNIQUE: Contiguous axial images were obtained from the base of the skull through the vertex without intravenous contrast. COMPARISON:  12/29/2005 CT head FINDINGS: Brain: No evidence of acute infarction, hemorrhage, hydrocephalus, extra-axial collection or mass lesion/mass effect. Mild chronic microvascular ischemic changes and parenchymal volume loss of the brain are progressed from the prior CT of the head. Lucency within the left hemi pons is compatible with an age-indeterminate lacunar infarct (series 2, image 11). Vascular: Calcific atherosclerosis of the cavernous internal carotid arteries and the vertebrobasilar system. Skull: Normal. Negative for fracture or focal lesion. Sinuses/Orbits: No acute finding. Other: None. IMPRESSION: 1. No evidence of large acute infarct, intracranial hemorrhage, or focal mass effect. 2. Mild chronic microvascular ischemic changes and parenchymal volume loss of the brain are progressed from 2007. 3. Left hemi pons lucency compatible with age-indeterminate lacunar infarct. Electronically Signed   By: Mitzi Hansen M.D.   On: 08/31/2016 01:26   Dg Chest Portable 1 View  Result Date: 08/31/2016 CLINICAL DATA:  70 y/o  M; dyspnea and fever. EXAM: PORTABLE CHEST 1 VIEW COMPARISON:  11/13/2009 chest radiograph FINDINGS: Stable enlarged cardiac silhouette. Two lead AICD. Stable obscuration of left heart border possibly  representing postsurgical changes and/or a prominent epicardial fat pad. Moderate bronchitic markings. No focal consolidation. Post median sternotomy with multiple broken wires similar to prior study. IMPRESSION: Moderate bronchitic markings may represent bronchitis. No new focal consolidation. Electronically Signed   By: Mitzi Hansen M.D.   On: 08/31/2016 02:49      Procedures   Critical Care performed: CRITICAL CARE Performed by: Darci Current   Total critical care time: 30 minutes  Critical care time was exclusive of separately billable procedures and treating other patients.  Critical care was necessary to treat or prevent imminent or life-threatening deterioration.  Critical care was time spent personally by me on the following activities: development of treatment plan with patient and/or surrogate as well as nursing, discussions with consultants, evaluation of patient's response to treatment, examination of patient, obtaining history from patient or surrogate, ordering and performing treatments and interventions, ordering and review of laboratory studies, ordering and review of radiographic studies, pulse oximetry and re-evaluation of patient's condition.  ____________________________________________   INITIAL IMPRESSION / ASSESSMENT AND PLAN / ED COURSE  Pertinent labs & imaging results that were available during my care of the patient were reviewed by me and considered in my medical decision making (see chart for details).  History physical exam consistent with Sepsis and Pancreatitis as such patient discussed with Dr. Tobi Bastos. IV Vancomycin and Zosyn given. 9ml/kg NS given   Clinical Course     ____________________________________________  FINAL CLINICAL IMPRESSION(S) / ED DIAGNOSES  Pancreatitis Sepsis    MEDICATIONS GIVEN DURING THIS VISIT:  Medications  pantoprazole (PROTONIX) injection 40 mg (not administered)  piperacillin-tazobactam  (ZOSYN) IVPB 3.375 g (not administered)  vancomycin (VANCOCIN) IVPB 1000 mg/200 mL premix (not administered)  sodium chloride 0.9 % bolus 1,000 mL (not administered)  piperacillin-tazobactam (ZOSYN) IVPB 3.375 g (0 g Intravenous Stopped 08/31/16 0118)  vancomycin (VANCOCIN) IVPB 1000 mg/200 mL premix (0 mg Intravenous Stopped 08/31/16 0152)  sodium chloride 0.9 % bolus 1,000 mL (1,000 mLs Intravenous New Bag/Given 08/31/16 0200)  acetaminophen (TYLENOL) suppository 975 mg (975 mg Rectal Given 08/31/16 0210)     NEW OUTPATIENT MEDICATIONS STARTED DURING THIS VISIT:  New Prescriptions   No medications on file    Modified Medications   No medications on file    Discontinued Medications   ALPRAZOLAM (XANAX) 0.5 MG TABLET    Take 0.5 mg by mouth at bedtime as needed for anxiety.   AUGMENTED BETAMETHASONE DIPROPIONATE (DIPROLENE-AF) 0.05 % CREAM    Apply 1 application topically 2 (two) times daily.    FUROSEMIDE (LASIX) 20 MG TABLET    Take 20 mg by mouth daily.      Note:  This document was prepared using Dragon voice recognition software and may include unintentional dictation errors. n     Darci Current, MD 08/31/16 1610    Darci Current, MD 08/31/16 [phone]

## 2016-08-31 NOTE — Progress Notes (Signed)
[facility] - [place] at [facility]   PATIENT NAME: Onnie Hatchel    MR#:  [id number]  DATE OF BIRTH:  1945/11/24  SUBJECTIVE:  CHIEF COMPLAINT:   Chief Complaint  Patient presents with  . Altered Mental Status   The patient is 70 year old Caucasian male with past medical history significant for history of coronary artery disease, CAD, congestive heart failure, COPD, diabetes, hypertension, hyperlipidemia, peripheral vascular disease, pancreatitis, who presents to the hospital with confusion, epigastric abdominal pain, high fever. In the emergency room he was noted to be jaundiced with total bilirubin of 3.5, elevated lipase level of 1700. CT of abdomen and pelvis revealed pancreatitis, nonobstructive nephrolithiasis, no hydronephrosis, ultrasound of abdomen showed possible left renal mass. Blood cultures are growing Escherichia coli, patient is on vancomycin and meropenem. Urinalysis was unremarkable, chest x-ray revealed cardiomegaly, vascular congestion. Patient is on IV fluids, 6 mcgs of Levophed, urinary output is low .  Review of Systems  Unable to perform ROS: Critical illness    VITAL SIGNS: Blood pressure 101/75, pulse 61, temperature 98.7 F (37.1 C), temperature source Axillary, resp. rate 14, height 5\' 11"  (1.803 m), weight (!) 140.1 kg (308 lb 13.8 oz), SpO2 96 %.  PHYSICAL EXAMINATION:   GENERAL:  70 y.o.-year-old patient lying in the bed with no acute distress, is undergoing triple lumen placement by PA.  EYES: Pupils equal, round, reactive to light and accommodation. No scleral icterus. Extraocular muscles intact.  HEENT: Head atraumatic, normocephalic. Oropharynx and nasopharynx clear.  NECK:  Supple, no jugular venous distention. No thyroid enlargement, no tenderness.  LUNGS: Some diminished breath sounds bilaterally, especially at bases, no wheezing, rales,rhonchi or crepitation. Intermittent use of accessory muscles of respiration.   CARDIOVASCULAR: S1, S2 normal. No murmurs, rubs, or gallops.  ABDOMEN: Soft, tight upper abdomen, no significant pain on palpation at present, distended. Bowel sounds diminished. No organomegaly or mass.  EXTREMITIES: No pedal edema, cyanosis, or clubbing.  NEUROLOGIC: Cranial nerves II through XII are intact. Muscle strength 5/5 in all extremities. Sensation intact. Gait not checked.  PSYCHIATRIC: The patient is alert and oriented x 3.  SKIN: No obvious rash, lesion, or ulcer.   ORDERS/RESULTS REVIEWED:   CBC  Recent Labs Lab 08/31/16 0016 08/31/16 0701  WBC 6.2 9.4  HGB 13.0 12.8*  HCT 38.0* 37.5*  PLT 93* 92*  MCV 94.3 94.9  MCH 32.3 32.5  MCHC 34.2 34.2  RDW 14.0 14.2  LYMPHSABS 0.1*  --   MONOABS 0.2  --   EOSABS 0.1  --   BASOSABS 0.0  --    ------------------------------------------------------------------------------------------------------------------  Chemistries   Recent Labs Lab 08/31/16 0016 08/31/16 0701 08/31/16 1230  NA 137 136 135  K 5.2* 5.3* 5.1  CL 105 106 105  CO2 26 23 24   GLUCOSE 153* 108* 131*  BUN 45* 46* 52*  CREATININE 3.04* 3.50* 3.99*  CALCIUM 8.9 8.1* 7.7*  AST 133* 109*  --   ALT 87* 87*  --   ALKPHOS 232* 194*  --   BILITOT 3.5* 3.8*  --    ------------------------------------------------------------------------------------------------------------------ estimated creatinine clearance is 24 mL/min (by C-G formula based on SCr of 3.99 mg/dL (H)). ------------------------------------------------------------------------------------------------------------------ No results for input(s): TSH, T4TOTAL, T3FREE, THYROIDAB in the last 72 hours.  Invalid input(s): FREET3  Cardiac Enzymes No results for input(s): CKMB, TROPONINI, MYOGLOBIN in the last 168 hours.  Invalid input(s): CK ------------------------------------------------------------------------------------------------------------------ Invalid input(s):  POCBNP ---------------------------------------------------------------------------------------------------------------  RADIOLOGY: Ct Abdomen Pelvis Wo Contrast  Result Date:  08/31/2016 CLINICAL DATA:  Epigastric pain, fever, elevated lipase, status post cholecystectomy. EXAM: CT ABDOMEN AND PELVIS WITHOUT CONTRAST TECHNIQUE: Multidetector CT imaging of the abdomen and pelvis was performed following the standard protocol without IV contrast. COMPARISON:  02/17/2008 FINDINGS: Lower chest: Lung bases shows bilateral posterior or atelectasis. Trace posterior pleural effusion. The study is limited without IV contrast. Hepatobiliary: Unenhanced liver shows no biliary ductal dilatation. The patient is status post cholecystectomy. Pancreas: There is subtle mild stranding of peripancreatic fat especially in the region of pancreatic head and pancreatic body. Mild pancreatitis cannot be excluded. Clinical correlation is necessary. Spleen: Unenhanced spleen measures 16.7 cm in length. Adrenals/Urinary Tract: No adrenal gland again noted bilateral thickened adrenal glands. No hydronephrosis or hydroureter. There is nonobstructive cortical calcification in midpole of the right kidney anteriorly measures 3.5 mm. Exophytic lesion upper pole medial aspect of the right kidney measures 1.3 cm. This may represent mild complex cyst. Correlation with ultrasound or MRI is recommended. Exophytic cyst in midpole of the left kidney measures 1.3 cm. There is a cortical cyst in upper pole of the left kidney measures 1.5 cm. No calcified ureteral calculi are noted. Stomach/Bowel: There is no small bowel obstruction. Moderate stool noted throughout the colon. Vascular/Lymphatic: Atherosclerotic calcifications are noted splenic artery and hepatic artery. SMA calcifications are noted. Mild atherosclerotic calcification distal abdominal aorta and iliac arteries. No aortic aneurysm. There is a pathologic portacaval lymph node measures 2.4 cm.  This might be reactive. Clinical correlation is necessary. Please see axial image 38. On the prior exam this measures 1.7 cm. Reproductive: Prostate gland seminal vesicles are unremarkable. No pelvic mass. The urinary bladder is unremarkable. Other: No ascites or free abdominal air. Small nonspecific bilateral inguinal lymph nodes are noted. Musculoskeletal: No destructive bony lesions are noted. There are degenerative changes thoracolumbar spine. IMPRESSION: 1. Limited study without IV contrast. There is mild stranding of peripancreatic fat especially in the region of pancreatic head and body of the pancreas. Mild pancreatitis cannot be excluded. 2. Bilateral lung bases posterior of atelectasis. Trace pleural effusion. 3. Status post cholecystectomy. 4. Right nonobstructive nephrolithiasis. Probable mild complex cyst in upper pole medial aspect of the right kidney measures 1.3 cm. Further correlation with ultrasound and/or MRI is recommended. Left renal cysts are noted. No hydronephrosis or hydroureter. 5. There is enlarged lymph node in portal caval region measures 2.4 cm with mild progression from prior exam this may be reactive. Clinical correlation is necessary. 6. Moderate colonic stool. 7. Again noted thickening of adrenal glands bilaterally. 8. Degenerative changes thoracolumbar spine. 9. No small bowel obstruction. Electronically Signed   By: Natasha Mead M.D.   On: 08/31/2016 09:24   Ct Head Wo Contrast  Result Date: 08/31/2016 CLINICAL DATA:  70 y/o  M; altered mental status. EXAM: CT HEAD WITHOUT CONTRAST TECHNIQUE: Contiguous axial images were obtained from the base of the skull through the vertex without intravenous contrast. COMPARISON:  12/29/2005 CT head FINDINGS: Brain: No evidence of acute infarction, hemorrhage, hydrocephalus, extra-axial collection or mass lesion/mass effect. Mild chronic microvascular ischemic changes and parenchymal volume loss of the brain are progressed from the prior CT  of the head. Lucency within the left hemi pons is compatible with an age-indeterminate lacunar infarct (series 2, image 11). Vascular: Calcific atherosclerosis of the cavernous internal carotid arteries and the vertebrobasilar system. Skull: Normal. Negative for fracture or focal lesion. Sinuses/Orbits: No acute finding. Other: None. IMPRESSION: 1. No evidence of large acute infarct, intracranial hemorrhage, or focal mass  effect. 2. Mild chronic microvascular ischemic changes and parenchymal volume loss of the brain are progressed from 2007. 3. Left hemi pons lucency compatible with age-indeterminate lacunar infarct. Electronically Signed   By: Mitzi HansenLance  Furusawa-Stratton M.D.   On: 08/31/2016 01:26   [place] Abdomen Complete  Result Date: 08/31/2016 CLINICAL DATA:  Abdominal pain EXAM: ABDOMEN ULTRASOUND COMPLETE COMPARISON:  CT abdomen and pelvis August 31, 2016 FINDINGS: Gallbladder: Surgically absent. Common bile duct: Diameter: 4 mm. There is no intrahepatic, common hepatic, or common bile duct dilatation. Liver: No focal lesion identified. Within normal limits in parenchymal echogenicity. IVC: No abnormality visualized. Pancreas: Visualized portion unremarkable. Much of the pancreas is obscured by gas. Spleen: Spleen measures 9.6 x 15.3 x 5.6 cm with a measures splenic volume of 428 cubic cm. No focal splenic lesions are evident. Right Kidney: Length: 11.2 cm. Echogenicity within normal limits. There is renal cortical thinning. No mass or hydronephrosis visualized. Left Kidney: Length: 9.8 cm. Echogenicity within normal limits. There is renal cortical thinning. No hydronephrosis visualized. There is a hypoechoic but apparently noncystic mass arising from the upper pole of the left kidney measuring 2.0 x 2.1 x 1.8 cm. Abdominal aorta: No aneurysm visualized. Other findings: No demonstrable ascites. IMPRESSION: Hypoechoic but noncystic appearing mass arising from left kidney upper pole region. This mass measures  2.0 x 2.1 x 1.8 cm. Further evaluation with pre and post contrast MRI or CT should be considered. MRI is preferred in younger patients (due to lack of ionizing radiation) and for evaluating calcified lesion(s). Absent gallbladder. Prominent spleen without focal splenic lesion. Much of pancreas obscured by gas. Visualized portions of pancreas appear unremarkable. Each kidney shows renal cortical thinning, a finding that may be indicative of a degree of medical renal disease. The renal echogenicity is within normal limits bilaterally. No obstructing foci identified in either kidney. Electronically Signed   By: Bretta BangWilliam  Woodruff III M.D.   On: 08/31/2016 09:39   Dg Chest Port 1 View  Result Date: 08/31/2016 CLINICAL DATA:  Central line adjustment EXAM: PORTABLE CHEST 1 VIEW COMPARISON:  08/31/2016 FINDINGS: Right central line tip is in the SVC. No pneumothorax. 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 Prior CABG. No confluent opacities or overt edema. No effusions or acute bony abnormality. IMPRESSION: Right central line tip in the SVC.  No pneumothorax. Cardiomegaly, vascular congestion. Electronically Signed   By: Charlett NoseKevin  Dover M.D.   On: 08/31/2016 13:09   Dg Chest Port 1 View  Result Date: 08/31/2016 CLINICAL DATA:  Central line placement. EXAM: PORTABLE CHEST 1 VIEW 12:29 p.m. COMPARISON:  08/31/2016 at 2:18 a.m. FINDINGS: There is a new right jugular vein catheter with the tip in the superior vena cava. No pneumothorax. Heart size and pulmonary vascularity are normal. Lungs are clear. AICD in place. CABG. IMPRESSION: Central line appears in good position. No pneumothorax. No active disease. Electronically Signed   By: Francene BoyersJames  Maxwell M.D.   On: 08/31/2016 12:45   Dg Chest Portable 1 View  Result Date: 08/31/2016 CLINICAL DATA:  70 y/o  M; dyspnea and fever. EXAM: PORTABLE CHEST 1 VIEW COMPARISON:  11/13/2009 chest radiograph FINDINGS: Stable enlarged cardiac silhouette. Two lead AICD. Stable  obscuration of left heart border possibly representing postsurgical changes and/or a prominent epicardial fat pad. Moderate bronchitic markings. No focal consolidation. Post median sternotomy with multiple broken wires similar to prior study. IMPRESSION: Moderate bronchitic markings may represent bronchitis. No new focal consolidation. Electronically Signed   By: Buzzy HanLance  Furusawa-Stratton M.D.  On: 08/31/2016 02:49    EKG:  Orders placed or performed during the hospital encounter of 08/30/16  . EKG 12-Lead  . EKG 12-Lead  . ED EKG 12-Lead  . ED EKG 12-Lead    ASSESSMENT AND PLAN:  Principal Problem:   Sepsis (HCC) Active Problems:   Acute pancreatitis  #1. Escherichia coli sepsis, continue meropenem, source of Escherichia coli is unclear at present, urine analysis was unremarkable, suspect cholangitis versus infection in pancreas, appreciate gastroenterology consultation, get surgery input, continue IV fluids, follow clinically #2Septic shock, continue Levophed, IV fluids, follow closely, get nephrologist involved to initiate CRRT if needed #3. Acute on chronic renal failure, get nephrologist input, no hydronephrosis was noted on CT, possible left renal mass, which needs to be worked up with MRI, patient may need to have temporary hemodialysis catheter placed due to renal failure #4. Hypoglycemia, continue D5 water, advance to D10 if needed #5. Hyperkalemia, improved with bicarbonate drip, may need CRRT, discussed with Dr. Thedore MinsSingh #6. Acute pancreatitis, continue IV fluids, follow lipase level, nothing by mouth #7. Elevated transaminases and total bilirubin, worsened to 3.8 today, get direct bilirubin, could be related to infection, no obvious obstruction, common bile duct was 4 mm on recent studies, awaiting for surgery input, appreciate gastroenterologist recommendations, following LFTs in the morning #8. Lactic acidosis, improved with conservative therapy   Management plans discussed  with the patient, family and they are in agreement.   DRUG ALLERGIES:  Allergies  Allergen Reactions  . Sulfa Antibiotics Other (See Comments)    hyperkalemia  . Other Other (See Comments)    Agents that cause hyperkalemia  . Umeclidinium Other (See Comments)    Chest congestion  . Clarithromycin Hives  . Ramipril Cough  . Tiotropium Other (See Comments)    Chest congestion    CODE STATUS:     Code Status Orders        Start     Ordered   08/31/16 0544  Full code  Continuous     08/31/16 0543    Code Status History    Date Active Date Inactive Code Status Order ID Comments User Context   This patient has a current code status but no historical code status.      TOTAL Critical care TIME TAKING CARE OF THIS PATIENT: 45 minutes.    Katharina CaperVAICKUTE,Tahje Borawski M.D on 08/31/2016 at 6:06 PM  Between 7am to 6pm - Pager - [phone]  After 6pm go to [email or web address] - password [email or web address] [facility]  [facility]  Office  [phone]  CC: Primary care physician; CROWDER, Pete PeltJONATHAN EARL, MD

## 2016-08-31 NOTE — Procedures (Signed)
Central Venous Catheter Insertion Procedure Note Brent Silva [id number] 1946-03-01  Procedure: Insertion of Central Venous Catheter Indications: Assessment of intravascular volume, Drug and/or fluid administration and Frequent blood sampling  Procedure Details Consent: Risks of procedure as well as the alternatives and risks of each were explained to the (patient/caregiver).  Consent for procedure obtained. Time Out: Verified patient identification, verified procedure, site/side was marked, verified correct patient position, special equipment/implants available, medications/allergies/relevent history reviewed, required imaging and test results available.  Performed  Maximum sterile technique was used including antiseptics, cap, gloves, gown, hand hygiene, mask and sheet. Skin prep: Chlorhexidine; local anesthetic administered A antimicrobial bonded/coated triple lumen catheter was placed in the right internal jugular vein using the Seldinger technique.  Evaluation Blood flow good Complications: No apparent complications Patient did tolerate procedure well. Chest X-ray ordered to verify placement.  CXR: normal.  Right internal jugular central line placed utilizing ultrasound no complications noted during procedure.  Sonda Rumbleana Blakeney, AGNP  Pulmonary/Critical Care Pager [phone] (please enter 7 digits) PCCM Consult Pager [phone] (please enter 7 digits)  Attending Note Present for procedure, direct observation performed.   Stephanie AcreVishal Johngabriel Verde, MD [facility] Pager [phone]- [phone] (please enter 7-digits) On Call Pager - [phone] (please enter 7-digits)

## 2016-08-31 NOTE — Progress Notes (Signed)
Pt had several low blood sugars throughout the shift. Pt given D50 X2 and started on D5 NS @75ml /hr.

## 2016-08-31 NOTE — Progress Notes (Signed)
Pharmacy Antibiotic Note  Brent Silva is a 70 y.o. male admitted on 08/30/2016 with  bacteremia.  Pharmacy has been consulted for Meropenem dosing.  Plan: Will start patient on meropenem 1gm IV every 12 hours based on CrCl <2550ml/min.   Height: 5\' 11"  (180.3 cm) Weight: (!) 308 lb 13.8 oz (140.1 kg) IBW/kg (Calculated) : 75.3  Temp (24hrs), Avg:100.2 F (37.9 C), Min:97.7 F (36.5 C), Max:104.2 F (40.1 C)   Recent Labs Lab 08/31/16 0016 08/31/16 0405 08/31/16 0701  WBC 6.2  --  9.4  CREATININE 3.04*  --  3.50*  LATICACIDVEN 1.9 2.0* 1.2    Estimated Creatinine Clearance: 27.3 mL/min (by C-G formula based on SCr of 3.5 mg/dL (H)).    Allergies  Allergen Reactions  . Sulfa Antibiotics Other (See Comments)    hyperkalemia  . Other Other (See Comments)    Agents that cause hyperkalemia  . Umeclidinium Other (See Comments)    Chest congestion  . Clarithromycin Hives  . Ramipril Cough  . Tiotropium Other (See Comments)    Chest congestion    Antimicrobials this admission: 12/13 Zosyn  >> 12/13 12/13 Meropenem >>   Dose adjustments this admission:   Microbiology results: 12/13 BCx: 4x4 GNR 12/13 BCID: E.Coli   12/13 MRSA PCR: negative   Thank you for allowing pharmacy to be a part of this patient's care.  Lendon ColonelSheema M Charyl Minervini,PharmD, BCPS Clinical Pharmacist  08/31/2016 1:19 PM

## 2016-08-31 NOTE — Consult Note (Addendum)
PULMONARY / CRITICAL CARE MEDICINE   Name: Gracelyn NurseJerry R Kopka MRN: [id number] DOB: 1946/03/15    ADMISSION DATE:  08/30/2016 CONSULTATION DATE:  08/31/16  REFERRING MD:  Dr. Tobi BastosPyreddy  CHIEF COMPLAINT:  Hypotension/ Acute Pancreatitis  HISTORY OF PRESENT ILLNESS:   Brent SkeansJerry Fuhriman is a 70 year old male with known history of coronary artery disease, chronic kidney disease, congestive heart failure, COPD, diabetes mellitus, hypertension, hyperlipidemia, myocardial infarction s/p Cardiac Cath in November of this year. Patient presented to ED with confusion , abdominal pain and a fever of 1309f. Code sepsis was initiated by the hospitalist .  Patient received 2l of fluid bolus.  Patient continues to be hypotensive and therefore started on Levophed and PCCM team was consulted for further management.  PAST MEDICAL HISTORY :  He  has a past medical history of Arthritis; CAD (coronary artery disease); CHF (congestive heart failure) (HCC); Chronic kidney disease; COPD (chronic obstructive pulmonary disease) (HCC); Diabetes mellitus without complication (HCC); Hyperlipidemia; Hypertension; Myocardial infarction (2000); and PVD (peripheral vascular disease) (HCC).  PAST SURGICAL HISTORY: He  has a past surgical history that includes Coronary artery bypass graft (2005); Cardiac defibrillator placement (11/2014); Joint replacement (Right, 2005); and Fracture surgery (Left, 1998).  Allergies  Allergen Reactions  . Sulfa Antibiotics Other (See Comments)    hyperkalemia  . Other Other (See Comments)    Agents that cause hyperkalemia  . Umeclidinium Other (See Comments)    Chest congestion  . Clarithromycin Hives  . Ramipril Cough  . Tiotropium Other (See Comments)    Chest congestion    No current facility-administered medications on file prior to encounter.    Current Outpatient Prescriptions on File Prior to Encounter  Medication Sig  . albuterol (VENTOLIN HFA) 108 (90 BASE) MCG/ACT inhaler Inhale 2 puffs  into the lungs every 6 (six) hours as needed for wheezing.   Marland Kitchen. aspirin 81 MG tablet Take 81 mg by mouth daily.   Marland Kitchen. atorvastatin (LIPITOR) 40 MG tablet Take 40 mg by mouth at bedtime.   Marland Kitchen. doxazosin (CARDURA) 2 MG tablet Take 2 mg by mouth daily.   . Fluticasone-Salmeterol (ADVAIR DISKUS) 500-50 MCG/DOSE AEPB Inhale 1 puff into the lungs 2 (two) times daily.   Marland Kitchen. glipiZIDE (GLUCOTROL) 10 MG tablet Take 20 mg by mouth 2 (two) times daily before a meal.   . Insulin Glargine (LANTUS SOLOSTAR) 100 UNIT/ML Solostar Pen Inject 70 Units into the skin at bedtime.   Marland Kitchen. loratadine (CLARITIN) 10 MG tablet Take 10 mg by mouth daily as needed.   . metoprolol succinate (TOPROL-XL) 100 MG 24 hr tablet Take 100 mg by mouth daily.   . nitroGLYCERIN (NITROSTAT) 0.4 MG SL tablet Place 0.4 mg under the tongue every 5 (five) minutes as needed for chest pain.   Marland Kitchen. omeprazole (PRILOSEC) 20 MG capsule Take 20 mg by mouth 2 (two) times daily before a meal.   . Polyethylene Glycol 3350 POWD Take 1 Dose by mouth daily as needed.   . pregabalin (LYRICA) 75 MG capsule One po QD for one week, then one po Bid for one week, then one po tid  . ranolazine (RANEXA) 1000 MG SR tablet Take 1,000 mg by mouth 2 (two) times daily.   . tamsulosin (FLOMAX) 0.4 MG CAPS capsule Take 0.4 mg by mouth daily.   Marland Kitchen. losartan (COZAAR) 25 MG tablet Take 12.5 mg by mouth daily.   . nitroGLYCERIN (NITRODUR - DOSED IN MG/24 HR) 0.4 mg/hr patch Place onto the skin.  FAMILY HISTORY:  His indicated that his father is deceased. He indicated that his brother is deceased.    SOCIAL HISTORY: He  reports that he quit smoking about 12 years ago. His smoking use included Cigarettes. He has a 50.00 pack-year smoking history. He has never used smokeless tobacco. He reports that he does not drink alcohol or use drugs.  REVIEW OF SYSTEMS:   Review of Systems  Constitutional: Positive for chills, fever and malaise/fatigue.  HENT: Negative for congestion, ear  discharge, ear pain, nosebleeds and sinus pain.   Eyes: Negative for photophobia, pain and discharge.  Respiratory: Negative for shortness of breath, wheezing and stridor.   Cardiovascular: Negative for palpitations, orthopnea and leg swelling.  Gastrointestinal: Positive for abdominal pain and nausea. Negative for blood in stool, constipation and diarrhea.  Genitourinary: Negative for frequency, hematuria and urgency.  Musculoskeletal: Negative for back pain and neck pain.  Skin: Negative for itching and rash.  Neurological: Negative for tremors, sensory change, speech change and weakness.  Endo/Heme/Allergies: Negative for environmental allergies and polydipsia.  Psychiatric/Behavioral: Negative for hallucinations, substance abuse and suicidal ideas. The patient is not nervous/anxious.      SUBJECTIVE:  Patient states that he has" right upper quadrant pain and is very tired currently"  VITAL SIGNS: BP (!) 91/44   Pulse 64   Temp (!) 102.4 F (39.1 C) (Rectal)   Resp 15   Ht 5\' 11"  (1.803 m)   Wt (!) 139.7 kg (308 lb)   SpO2 93%   BMI 42.96 kg/m   HEMODYNAMICS:    VENTILATOR SETTINGS:    INTAKE / OUTPUT: No intake/output data recorded.  PHYSICAL EXAMINATION: General:  Morbidly obese, elderly genleman, in no acute distress Neuro:  Awake but confused HEENT: Atraumatic, normocephalic, no discharge , no JVD appreciated Cardiovascular:  S1s2, regular, no MRG noted Lungs:  Diminished bibasilar, no wheezes, crackles, rhonchi noted Abdomen: Tender on light palpation, guarding, firm  Musculoskeletal:  No inflammation/deformity noted Skin: Grossly intact  LABS:  BMET  Recent Labs Lab 08/31/16 0016  NA 137  K 5.2*  CL 105  CO2 26  BUN 45*  CREATININE 3.04*  GLUCOSE 153*    Electrolytes  Recent Labs Lab 08/31/16 0016  CALCIUM 8.9    CBC  Recent Labs Lab 08/31/16 0016  WBC 6.2  HGB 13.0  HCT 38.0*  PLT 93*    Coag's No results for input(s):  APTT, INR in the last 168 hours.  Sepsis Markers  Recent Labs Lab 08/31/16 0016 08/31/16 0405  LATICACIDVEN 1.9 2.0*    ABG No results for input(s): PHART, PCO2ART, PO2ART in the last 168 hours.  Liver Enzymes  Recent Labs Lab 08/31/16 0016  AST 133*  ALT 87*  ALKPHOS 232*  BILITOT 3.5*  ALBUMIN 4.1    Cardiac Enzymes No results for input(s): TROPONINI, PROBNP in the last 168 hours.  Glucose No results for input(s): GLUCAP in the last 168 hours.  Imaging Ct Head Wo Contrast  Result Date: 08/31/2016 CLINICAL DATA:  70 y/o  M; altered mental status. EXAM: CT HEAD WITHOUT CONTRAST TECHNIQUE: Contiguous axial images were obtained from the base of the skull through the vertex without intravenous contrast. COMPARISON:  12/29/2005 CT head FINDINGS: Brain: No evidence of acute infarction, hemorrhage, hydrocephalus, extra-axial collection or mass lesion/mass effect. Mild chronic microvascular ischemic changes and parenchymal volume loss of the brain are progressed from the prior CT of the head. Lucency within the left hemi pons is compatible with an  age-indeterminate lacunar infarct (series 2, image 11). Vascular: Calcific atherosclerosis of the cavernous internal carotid arteries and the vertebrobasilar system. Skull: Normal. Negative for fracture or focal lesion. Sinuses/Orbits: No acute finding. Other: None. IMPRESSION: 1. No evidence of large acute infarct, intracranial hemorrhage, or focal mass effect. 2. Mild chronic microvascular ischemic changes and parenchymal volume loss of the brain are progressed from 2007. 3. Left hemi pons lucency compatible with age-indeterminate lacunar infarct. Electronically Signed   By: Mitzi Hansen M.D.   On: 08/31/2016 01:26   Dg Chest Portable 1 View  Result Date: 08/31/2016 CLINICAL DATA:  70 y/o  M; dyspnea and fever. EXAM: PORTABLE CHEST 1 VIEW COMPARISON:  11/13/2009 chest radiograph FINDINGS: Stable enlarged cardiac silhouette.  Two lead AICD. Stable obscuration of left heart border possibly representing postsurgical changes and/or a prominent epicardial fat pad. Moderate bronchitic markings. No focal consolidation. Post median sternotomy with multiple broken wires similar to prior study. IMPRESSION: Moderate bronchitic markings may represent bronchitis. No new focal consolidation. Electronically Signed   By: Mitzi Hansen M.D.   On: 08/31/2016 02:49     STUDIES:  12/13 CT head>>No evidence of large acute infarct, intracranial hemorrhage, orfocal mass effect Ct abdomen>>mild stranding of peripancreatic fat especially in the region of pancreatic head and body of the pancreas, pathologic portacaval lymph, node measures 2.4 cm, surgically absent gallbladder Abd  ultrasound>>2.0x2.1x1.8cm left kidney mas, pancreas obscured by gas CULTURES: 12/13 blood culture>>e.coli 12/13 Urine culture>>  ANTIBIOTICS: 12/13>> Zosyn 12/13>>meropenem  SIGNIFICANT EVENTS: 12/13 Patient admitted to the ICU with sepsis secondary to pancreatitis  And hypotension  LINES/TUBES: RIJ CVL 12/13   ASSESSMENT / PLAN:  PULMONARY A: Hx of COPD P:   Continue to support with O2  To keep sats >92% Bronchodilators  CARDIOVASCULAR A:  CAD CHF Hypotension secondary to possible sepsis Hyperlipidemia Hx of MI -Cardiac Cath MI CABG-2005 P Continue levophed,  Keep MAP goals >65 Received 2 L of fluid in ED Continuous telemetry  RENAL A:   Chronic kidney disease Hyperkalemia P:   Follow cbc Replace electrolytes per ICU protocol Avoid nephrotoxic drugs , I/v dyes, contrast medium.  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 Reorientation Minimize sedating drugs  Bincy Varughese,AG-ACNP Pulmonary and Critical Care Medicine [facility]   08/31/2016, 5:50 AM STAFF NOTE: I, Dr. Stephanie Acre have personally reviewed patient's available data, including medical history, events of note, physical examination and test results as part of my evaluation. I have discussed with NP Varughese  and other care providers such as pharmacist, RN and RRT.    HPI: Brent Silva is a 70 year old male with known history of coronary artery disease, chronic kidney disease, congestive heart failure, COPD, diabetes mellitus, hypertension, hyperlipidemia, myocardial infarction s/p Cardiac Cath in November of this year. Patient presented to ED with confusion , abdominal pain  and a fever of 11165f. Code sepsis was initiated by the hospitalist .  Patient received 2l of fluid bolus.  This morning had to increase levophed requirement mildly. Patient more awake and able to give some history, stated no trauma, no bug bite/stings, and an episode of pancreatitis several years ago, unknown etiology. ALso has a hx of renal cyst, for which he follow [facility].   A: Pancreatitis Septic Shock Hx of copd CAD CHF HLD Elevated lipase Elevated ammonia Left kidney mass ([facility]) Portal Caval node Acute on Chronic Renal Failure Hyperkalemia   P:  - cont with trend lipase - cont with IVFs - RIJ CVL placed due to increasing levo requirement - patient critical but not in acute severe shock  - cont with pressors, maintain map >65 - d/w case with surgery, Dr. Orvis BrillLoflin, recommended MRCP to further evaluate  portal caval node, and make sure it is not a mass.  Marland Kitchen.  Rest per NP/medical resident whose note is outlined above and that I agree with  The patient is critically ill with multiple organ systems failure and requires high complexity decision making for assessment and support, frequent evaluation and titration of therapies, application of advanced monitoring technologies and extensive interpretation of multiple databases.   Critical Care Time devoted to patient care services described in this note is  45 Minutes.   This time reflects time of care of this signee Dr Stephanie AcreVishal Carah Barrientes.  This critical care time does not reflect procedure time, or teaching time or supervisory time of PA/NP/Med-student/Med Resident etc but could involve care discussion time.  Stephanie AcreVishal Sabrina Keough, MD [place] [facility] Pager [phone] (please enter 7-digits) On Call Pager - [phone] (please enter 7-digits)  Note: This note was prepared with Dragon dictation along with smaller phrase technology. Any transcriptional errors that result from this process are unintentional.

## 2016-08-31 NOTE — ED Triage Notes (Signed)
EMS report: Wife called EMS for progressive confusion starting earlier last night, time report from EMS 2200. Pt is pale, tremoringer, A&O x 1. Pt has firm abdomen w/ hx or paracentesis. Negative stroke scale per EMS. Pt has no droop, aphasia, or drift.

## 2016-08-31 NOTE — Consult Note (Signed)
Date: 08/31/2016                  Patient Name:  Brent Silva  MRN: [id number]  DOB: 1946/02/10  Age / Sex: 70 y.o., male         PCP: CROWDER, Pete Pelt, MD                 Service Requesting Consult: Internal medicine                  Reason for Consult: Acute renal failure             History of Present Illness: Patient is a 70 y.o. male with medical problems of Long-standing diabetes for over 15 years, coronary disease with MI in 2000 with angioplasty, CABG 3 vessel at [facility] in 2005, defibrillator pacemaker placement, COPD, chronic kidney disease stage IV, BPH, renal mass followed at [facility], history of pancreatitis in the past, who was admitted to [facility] on 08/30/2016 for evaluation of epigastric abdominal pain, fever, confusion that started yesterday afternoon.   Patient had elevated lipase. CT scan of the abdomen showed possible pancreatitis, right nonobstructive nephrolithiasis, complex cyst in the upper pole of the right kidney measuring 1.3 cm, He was also noted to be hypotensive upon arrival With volume resuscitation, patient has improved. His abdominal pain is resolved. He also had tremors at home which have improved significantly. He was disoriented when he first arrived, at present, he is alert and oriented and is able to recognize his family. He is oriented to time place and person. His baseline creatinine appears to be 2.7/GFR 23 from 08/19/2016. He is followed at [facility] for chronic kidney disease Presenting creatinine is 3.04. It has worsened to 3.99 today. Patient has a Foley catheter. Urine output is low. Patient's wife states that he was hospitalized at [facility] for volume overload from November 17 221. At the time of discharge, his diuretic was changed from Lasix to torsemide 20 mg daily.  Medications: Outpatient medications: Prescriptions Prior to Admission  Medication Sig Dispense Refill Last Dose  . acetaminophen (TYLENOL) 500 MG tablet Take 500 mg by mouth every 6 (six)  hours as needed.     Marland Kitchen albuterol (VENTOLIN HFA) 108 (90 BASE) MCG/ACT inhaler Inhale 2 puffs into the lungs every 6 (six) hours as needed for wheezing.    Taking  . aspirin 81 MG tablet Take 81 mg by mouth daily.    08/30/2016 at Unknown time  . atorvastatin (LIPITOR) 40 MG tablet Take 40 mg by mouth at bedtime.    08/30/2016 at Unknown time  . doxazosin (CARDURA) 2 MG tablet Take 2 mg by mouth daily.    08/30/2016 at Unknown time  . Fluticasone-Salmeterol (ADVAIR DISKUS) 500-50 MCG/DOSE AEPB Inhale 1 puff into the lungs 2 (two) times daily.    08/30/2016 at Unknown time  . glipiZIDE (GLUCOTROL) 10 MG tablet Take 20 mg by mouth 2 (two) times daily before a meal.    08/30/2016 at Unknown time  . hydrALAZINE (APRESOLINE) 25 MG tablet Take 25 mg by mouth 3 (three) times daily.   08/30/2016 at Unknown time  . Insulin Glargine (LANTUS SOLOSTAR) 100 UNIT/ML Solostar Pen Inject 70 Units into the skin at bedtime.    08/30/2016 at Unknown time  . isosorbide dinitrate (ISORDIL) 20 MG tablet Take 20 mg by mouth 3 (three) times daily.   08/30/2016 at Unknown time  . loratadine (CLARITIN) 10 MG tablet Take 10 mg by mouth  daily as needed.    08/30/2016 at Unknown time  . metoprolol succinate (TOPROL-XL) 100 MG 24 hr tablet Take 100 mg by mouth daily.    08/30/2016 at Unknown time  . nitroGLYCERIN (NITROSTAT) 0.4 MG SL tablet Place 0.4 mg under the tongue every 5 (five) minutes as needed for chest pain.    Taking  . omeprazole (PRILOSEC) 20 MG capsule Take 20 mg by mouth 2 (two) times daily before a meal.    08/30/2016 at Unknown time  . Polyethylene Glycol 3350 POWD Take 1 Dose by mouth daily as needed.    08/30/2016 at Unknown time  . pregabalin (LYRICA) 75 MG capsule One po QD for one week, then one po Bid for one week, then one po tid   08/30/2016 at Unknown time  . ranolazine (RANEXA) 1000 MG SR tablet Take 1,000 mg by mouth 2 (two) times daily.    08/30/2016 at Unknown time  . tamsulosin (FLOMAX) 0.4 MG CAPS  capsule Take 0.4 mg by mouth daily.    08/30/2016 at Unknown time  . torsemide (DEMADEX) 20 MG tablet Take 20 mg by mouth daily.   08/30/2016 at Unknown time  . losartan (COZAAR) 25 MG tablet Take 12.5 mg by mouth daily.    Taking  . nitroGLYCERIN (NITRODUR - DOSED IN MG/24 HR) 0.4 mg/hr patch Place onto the skin.   Not Taking    Current medications: Current Facility-Administered Medications  Medication Dose Route Frequency Provider Last Rate Last Dose  . dextrose 50 % solution           . 0.9 %  sodium chloride infusion   Intravenous Continuous Katharina Caper, MD 100 mL/hr at 08/31/16 1819    . acetaminophen (TYLENOL) tablet 650 mg  650 mg Oral Q6H PRN Ihor Austin, MD       Or  . acetaminophen (TYLENOL) suppository 650 mg  650 mg Rectal Q6H PRN Pavan Pyreddy, MD      . albuterol (PROVENTIL) (2.5 MG/3ML) 0.083% nebulizer solution 3 mL  3 mL Inhalation Q6H PRN Pavan Pyreddy, MD      . dextrose 5 %-0.9 % sodium chloride infusion   Intravenous Continuous Eugenie Norrie, NP 75 mL/hr at 08/31/16 1600    . dextrose 50 % solution 50 mL  1 ampule Intravenous Once Bincy S Varughese, NP      . MEDLINE mouth rinse  15 mL Mouth Rinse BID Ihor Austin, MD   15 mL at 08/31/16 1013  . meropenem (MERREM) 1 g in sodium chloride 0.9 % 100 mL IVPB  1 g Intravenous Q12H Katharina Caper, MD      . mometasone-formoterol (DULERA) 200-5 MCG/ACT inhaler 2 puff  2 puff Inhalation BID Ihor Austin, MD   2 puff at 08/31/16 0801  . morphine 4 MG/ML injection 1 mg  1 mg Intravenous Once Vishal Mungal, MD      . norepinephrine (LEVOPHED) 4mg  in D5W premix infusion  0-40 mcg/min Intravenous Titrated Pavan Pyreddy, MD 22.5 mL/hr at 08/31/16 1600 6 mcg/min at 08/31/16 1600  . ondansetron (ZOFRAN) tablet 4 mg  4 mg Oral Q6H PRN Ihor Austin, MD       Or  . ondansetron (ZOFRAN) injection 4 mg  4 mg Intravenous Q6H PRN Pavan Pyreddy, MD      . pantoprazole (PROTONIX) injection 40 mg  40 mg Intravenous Q24H Pavan  Pyreddy, MD   40 mg at 08/31/16 1013  . sodium bicarbonate 150 mEq in dextrose 5 %  1,000 mL infusion   Intravenous Continuous Katharina Caper, MD 50 mL/hr at 08/31/16 1600    . sodium chloride flush (NS) 0.9 % injection 3 mL  3 mL Intravenous Q12H Ihor Austin, MD   3 mL at 08/31/16 1013      Allergies: Allergies  Allergen Reactions  . Sulfa Antibiotics Other (See Comments)    hyperkalemia  . Other Other (See Comments)    Agents that cause hyperkalemia  . Umeclidinium Other (See Comments)    Chest congestion  . Clarithromycin Hives  . Ramipril Cough  . Tiotropium Other (See Comments)    Chest congestion      Past Medical History: Past Medical History:  Diagnosis Date  . Arthritis   . CAD (coronary artery disease)   . CHF (congestive heart failure) (HCC)   . Chronic kidney disease   . COPD (chronic obstructive pulmonary disease) (HCC)   . Diabetes mellitus without complication (HCC)   . Hyperlipidemia   . Hypertension   . Myocardial infarction 2000  . PVD (peripheral vascular disease) (HCC)      Past Surgical History: Past Surgical History:  Procedure Laterality Date  . CARDIAC DEFIBRILLATOR PLACEMENT  11/2014  . CORONARY ARTERY BYPASS GRAFT  2005  . FRACTURE SURGERY Left 1998   Screws in andkle  . JOINT REPLACEMENT Right 2005     Family History: Family History  Problem Relation Age of Onset  . Diabetes Brother   . Heart disease Brother   . Heart disease Father      Social History: Social History   Social History  . Marital status: Married    Spouse name: N/A  . Number of children: N/A  . Years of education: N/A   Occupational History  . part time job : Teacher, adult education    Social History Main Topics  . Smoking status: Former Smoker    Packs/day: 2.00    Years: 25.00    Types: Cigarettes    Quit date: 09/27/2003  . Smokeless tobacco: Never Used  . Alcohol use No  . Drug use: No  . Sexual activity: Not on file   Other Topics Concern  . Not  on file   Social History Narrative  . No narrative on file     Review of Systems: Gen: Progressive confusion, tremors at home HEENT: No vision or hearing problems. Chronic dry mouth CV: Chest pain at home, resolved now Resp: No cough or sputum GI: No vomiting, nausea, diarrhea GU : History of BPH. He takes Flomax MS: Pain in the back of the legs with walking Derm:  No acute complaints Psych: No complaints Heme: No complaints Neuro: Confusion yesterday. Resolved now Endocrine. Long-standing diabetes  Vital Signs: Blood pressure (!) 106/40, pulse 63, temperature 98.7 F (37.1 C), temperature source Axillary, resp. rate 10, height 5\' 11"  (1.803 m), weight (!) 140.1 kg (308 lb 13.8 oz), SpO2 96 %.   Intake/Output Summary (Last 24 hours) at 08/31/16 1957 Last data filed at 08/31/16 1819  Gross per 24 hour  Intake          1845.21 ml  Output              525 ml  Net          1320.21 ml    Weight trends: Filed Weights   08/31/16 0003 08/31/16 0530  Weight: (!) 139.7 kg (308 lb) (!) 140.1 kg (308 lb 13.8 oz)    Physical Exam: General:  Obese gentleman, laying  in the bed in no acute distress   HEENT Anicteric, moist oral mucous membranes   Neck:  Supple   Lungs: Normal breathing effort, clear anteriorly and laterally   Heart::  No rub or gallop   Abdomen: Soft, obese, nondistended, nontender   Extremities:  Trace to 1+ pitting edema over the feet   Neurologic: Alert, oriented to time place and person   Skin: Acute rashes      Foley: Present        Lab results: Basic Metabolic Panel:  Recent Labs Lab 08/31/16 0016 08/31/16 0701 08/31/16 1230  NA 137 136 135  K 5.2* 5.3* 5.1  CL 105 106 105  CO2 26 23 24   GLUCOSE 153* 108* 131*  BUN 45* 46* 52*  CREATININE 3.04* 3.50* 3.99*  CALCIUM 8.9 8.1* 7.7*    Liver Function Tests:  Recent Labs Lab 08/31/16 0701  AST 109*  ALT 87*  ALKPHOS 194*  BILITOT 3.8*  PROT 6.4*  ALBUMIN 3.6    Recent Labs Lab  08/31/16 0701  LIPASE 1,367*    Recent Labs Lab 08/31/16 0016  AMMONIA 42*    CBC:  Recent Labs Lab 08/31/16 0016 08/31/16 0701  WBC 6.2 9.4  NEUTROABS 5.8  --   HGB 13.0 12.8*  HCT 38.0* 37.5*  MCV 94.3 94.9  PLT 93* 92*    Cardiac Enzymes:  Recent Labs Lab 08/31/16 0016  CKTOTAL 83    BNP: Invalid input(s): POCBNP  CBG:  Recent Labs Lab 08/31/16 1418 08/31/16 1704 08/31/16 1707 08/31/16 1818 08/31/16 1940  GLUCAP 76 32* 33* 79 60*    Microbiology: Recent Results (from the past 720 hour(s))  Blood Culture (routine x 2)     Status: None (Preliminary result)   Collection Time: 08/31/16 12:16 AM  Result Value Ref Range Status   Specimen Description BLOOD RIGHT AC  Final   Special Requests BOTTLES DRAWN AEROBIC AND ANAEROBIC ANA11CC AER8CC  Final   Culture  Setup Time   Final    Organism ID to follow GRAM NEGATIVE RODS IN BOTH AEROBIC AND ANAEROBIC BOTTLES CRITICAL RESULT CALLED TO, READ BACK BY AND VERIFIED WITH: CHRISTINE KATSOUDAS 08/31/16 1202 SGD    Culture GRAM NEGATIVE RODS  Final   Report Status PENDING  Incomplete  Blood Culture ID Panel (Reflexed)     Status: Abnormal   Collection Time: 08/31/16 12:16 AM  Result Value Ref Range Status   Enterococcus species NOT DETECTED NOT DETECTED Final   Listeria monocytogenes NOT DETECTED NOT DETECTED Final   Staphylococcus species NOT DETECTED NOT DETECTED Final   Staphylococcus aureus NOT DETECTED NOT DETECTED Final   Streptococcus species NOT DETECTED NOT DETECTED Final   Streptococcus agalactiae NOT DETECTED NOT DETECTED Final   Streptococcus pneumoniae NOT DETECTED NOT DETECTED Final   Streptococcus pyogenes NOT DETECTED NOT DETECTED Final   Acinetobacter baumannii NOT DETECTED NOT DETECTED Final   Enterobacteriaceae species DETECTED (A) NOT DETECTED Final    Comment: CRITICAL RESULT CALLED TO, READ BACK BY AND VERIFIED WITH: CHRISTINE KATSOUDAS 08/31/16 1202 SGD    Enterobacter cloacae  complex NOT DETECTED NOT DETECTED Final   Escherichia coli DETECTED (A) NOT DETECTED Final    Comment: CRITICAL RESULT CALLED TO, READ BACK BY AND VERIFIED WITH: CHRISTINE KATSOUDAS 08/31/16 1202 SGD    Klebsiella oxytoca NOT DETECTED NOT DETECTED Final   Klebsiella pneumoniae NOT DETECTED NOT DETECTED Final   Proteus species NOT DETECTED NOT DETECTED Final   Serratia marcescens NOT DETECTED  NOT DETECTED Final   Carbapenem resistance NOT DETECTED NOT DETECTED Final   Haemophilus influenzae NOT DETECTED NOT DETECTED Final   Neisseria meningitidis NOT DETECTED NOT DETECTED Final   Pseudomonas aeruginosa NOT DETECTED NOT DETECTED Final   Candida albicans NOT DETECTED NOT DETECTED Final   Candida glabrata NOT DETECTED NOT DETECTED Final   Candida krusei NOT DETECTED NOT DETECTED Final   Candida parapsilosis NOT DETECTED NOT DETECTED Final   Candida tropicalis NOT DETECTED NOT DETECTED Final  Blood Culture (routine x 2)     Status: None (Preliminary result)   Collection Time: 08/31/16 12:17 AM  Result Value Ref Range Status   Specimen Description BLOOD LEFT AC  Final   Special Requests   Final    BOTTLES DRAWN AEROBIC AND ANAEROBIC AER11CC ANA10CC   Culture  Setup Time   Final    GRAM NEGATIVE RODS IN BOTH AEROBIC AND ANAEROBIC BOTTLES CRITICAL RESULT CALLED TO, READ BACK BY AND VERIFIED WITH: CHRISTINE KATSOUDAS 08/31/16 1202 SGD    Culture GRAM NEGATIVE RODS  Final   Report Status PENDING  Incomplete  MRSA PCR Screening     Status: None   Collection Time: 08/31/16  5:48 AM  Result Value Ref Range Status   MRSA by PCR NEGATIVE NEGATIVE Final    Comment:        The GeneXpert MRSA Assay (FDA approved for NASAL specimens only), is one component of a comprehensive MRSA colonization surveillance program. It is not intended to diagnose MRSA infection nor to guide or monitor treatment for MRSA infections.      Coagulation Studies:  Recent Labs  08/31/16 0701  LABPROT 14.3   INR 1.11    Urinalysis:  Recent Labs  08/31/16 1246  COLORURINE AMBER*  LABSPEC 1.020  PHURINE 5.0  GLUCOSEU 50*  HGBUR NEGATIVE  BILIRUBINUR NEGATIVE  KETONESUR NEGATIVE  PROTEINUR 30*  NITRITE NEGATIVE  LEUKOCYTESUR NEGATIVE        Imaging: Ct Abdomen Pelvis Wo Contrast  Result Date: 08/31/2016 CLINICAL DATA:  Epigastric pain, fever, elevated lipase, status post cholecystectomy. EXAM: CT ABDOMEN AND PELVIS WITHOUT CONTRAST TECHNIQUE: Multidetector CT imaging of the abdomen and pelvis was performed following the standard protocol without IV contrast. COMPARISON:  02/17/2008 FINDINGS: Lower chest: Lung bases shows bilateral posterior or atelectasis. Trace posterior pleural effusion. The study is limited without IV contrast. Hepatobiliary: Unenhanced liver shows no biliary ductal dilatation. The patient is status post cholecystectomy. Pancreas: There is subtle mild stranding of peripancreatic fat especially in the region of pancreatic head and pancreatic body. Mild pancreatitis cannot be excluded. Clinical correlation is necessary. Spleen: Unenhanced spleen measures 16.7 cm in length. Adrenals/Urinary Tract: No adrenal gland again noted bilateral thickened adrenal glands. No hydronephrosis or hydroureter. There is nonobstructive cortical calcification in midpole of the right kidney anteriorly measures 3.5 mm. Exophytic lesion upper pole medial aspect of the right kidney measures 1.3 cm. This may represent mild complex cyst. Correlation with ultrasound or MRI is recommended. Exophytic cyst in midpole of the left kidney measures 1.3 cm. There is a cortical cyst in upper pole of the left kidney measures 1.5 cm. No calcified ureteral calculi are noted. Stomach/Bowel: There is no small bowel obstruction. Moderate stool noted throughout the colon. Vascular/Lymphatic: Atherosclerotic calcifications are noted splenic artery and hepatic artery. SMA calcifications are noted. Mild atherosclerotic  calcification distal abdominal aorta and iliac arteries. No aortic aneurysm. There is a pathologic portacaval lymph node measures 2.4 cm. This might be reactive. Clinical correlation  is necessary. Please see axial image 38. On the prior exam this measures 1.7 cm. Reproductive: Prostate gland seminal vesicles are unremarkable. No pelvic mass. The urinary bladder is unremarkable. Other: No ascites or free abdominal air. Small nonspecific bilateral inguinal lymph nodes are noted. Musculoskeletal: No destructive bony lesions are noted. There are degenerative changes thoracolumbar spine. IMPRESSION: 1. Limited study without IV contrast. There is mild stranding of peripancreatic fat especially in the region of pancreatic head and body of the pancreas. Mild pancreatitis cannot be excluded. 2. Bilateral lung bases posterior of atelectasis. Trace pleural effusion. 3. Status post cholecystectomy. 4. Right nonobstructive nephrolithiasis. Probable mild complex cyst in upper pole medial aspect of the right kidney measures 1.3 cm. Further correlation with ultrasound and/or MRI is recommended. Left renal cysts are noted. No hydronephrosis or hydroureter. 5. There is enlarged lymph node in portal caval region measures 2.4 cm with mild progression from prior exam this may be reactive. Clinical correlation is necessary. 6. Moderate colonic stool. 7. Again noted thickening of adrenal glands bilaterally. 8. Degenerative changes thoracolumbar spine. 9. No small bowel obstruction. Electronically Signed   By: Natasha MeadLiviu  Pop M.D.   On: 08/31/2016 09:24   Ct Head Wo Contrast  Result Date: 08/31/2016 CLINICAL DATA:  70 y/o  M; altered mental status. EXAM: CT HEAD WITHOUT CONTRAST TECHNIQUE: Contiguous axial images were obtained from the base of the skull through the vertex without intravenous contrast. COMPARISON:  12/29/2005 CT head FINDINGS: Brain: No evidence of acute infarction, hemorrhage, hydrocephalus, extra-axial collection or mass  lesion/mass effect. Mild chronic microvascular ischemic changes and parenchymal volume loss of the brain are progressed from the prior CT of the head. Lucency within the left hemi pons is compatible with an age-indeterminate lacunar infarct (series 2, image 11). Vascular: Calcific atherosclerosis of the cavernous internal carotid arteries and the vertebrobasilar system. Skull: Normal. Negative for fracture or focal lesion. Sinuses/Orbits: No acute finding. Other: None. IMPRESSION: 1. No evidence of large acute infarct, intracranial hemorrhage, or focal mass effect. 2. Mild chronic microvascular ischemic changes and parenchymal volume loss of the brain are progressed from 2007. 3. Left hemi pons lucency compatible with age-indeterminate lacunar infarct. Electronically Signed   By: Mitzi HansenLance  Furusawa-Stratton M.D.   On: 08/31/2016 01:26   [place] Abdomen Complete  Result Date: 08/31/2016 CLINICAL DATA:  Abdominal pain EXAM: ABDOMEN ULTRASOUND COMPLETE COMPARISON:  CT abdomen and pelvis August 31, 2016 FINDINGS: Gallbladder: Surgically absent. Common bile duct: Diameter: 4 mm. There is no intrahepatic, common hepatic, or common bile duct dilatation. Liver: No focal lesion identified. Within normal limits in parenchymal echogenicity. IVC: No abnormality visualized. Pancreas: Visualized portion unremarkable. Much of the pancreas is obscured by gas. Spleen: Spleen measures 9.6 x 15.3 x 5.6 cm with a measures splenic volume of 428 cubic cm. No focal splenic lesions are evident. Right Kidney: Length: 11.2 cm. Echogenicity within normal limits. There is renal cortical thinning. No mass or hydronephrosis visualized. Left Kidney: Length: 9.8 cm. Echogenicity within normal limits. There is renal cortical thinning. No hydronephrosis visualized. There is a hypoechoic but apparently noncystic mass arising from the upper pole of the left kidney measuring 2.0 x 2.1 x 1.8 cm. Abdominal aorta: No aneurysm visualized. Other findings: No  demonstrable ascites. IMPRESSION: Hypoechoic but noncystic appearing mass arising from left kidney upper pole region. This mass measures 2.0 x 2.1 x 1.8 cm. Further evaluation with pre and post contrast MRI or CT should be considered. MRI is preferred in younger patients (due to  lack of ionizing radiation) and for evaluating calcified lesion(s). Absent gallbladder. Prominent spleen without focal splenic lesion. Much of pancreas obscured by gas. Visualized portions of pancreas appear unremarkable. Each kidney shows renal cortical thinning, a finding that may be indicative of a degree of medical renal disease. The renal echogenicity is within normal limits bilaterally. No obstructing foci identified in either kidney. Electronically Signed   By: Bretta Bang III M.D.   On: 08/31/2016 09:39   Dg Chest Port 1 View  Result Date: 08/31/2016 CLINICAL DATA:  Central line adjustment EXAM: PORTABLE CHEST 1 VIEW COMPARISON:  08/31/2016 FINDINGS: Right central line tip is in the SVC. No pneumothorax. 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 Prior CABG. No confluent opacities or overt edema. No effusions or acute bony abnormality. IMPRESSION: Right central line tip in the SVC.  No pneumothorax. Cardiomegaly, vascular congestion. Electronically Signed   By: Charlett Nose M.D.   On: 08/31/2016 13:09   Dg Chest Port 1 View  Result Date: 08/31/2016 CLINICAL DATA:  Central line placement. EXAM: PORTABLE CHEST 1 VIEW 12:29 p.m. COMPARISON:  08/31/2016 at 2:18 a.m. FINDINGS: There is a new right jugular vein catheter with the tip in the superior vena cava. No pneumothorax. Heart size and pulmonary vascularity are normal. Lungs are clear. AICD in place. CABG. IMPRESSION: Central line appears in good position. No pneumothorax. No active disease. Electronically Signed   By: Francene Boyers M.D.   On: 08/31/2016 12:45   Dg Chest Portable 1 View  Result Date: 08/31/2016 CLINICAL DATA:  70 y/o  M; dyspnea and fever. EXAM:  PORTABLE CHEST 1 VIEW COMPARISON:  11/13/2009 chest radiograph FINDINGS: Stable enlarged cardiac silhouette. Two lead AICD. Stable obscuration of left heart border possibly representing postsurgical changes and/or a prominent epicardial fat pad. Moderate bronchitic markings. No focal consolidation. Post median sternotomy with multiple broken wires similar to prior study. IMPRESSION: Moderate bronchitic markings may represent bronchitis. No new focal consolidation. Electronically Signed   By: Mitzi Hansen M.D.   On: 08/31/2016 02:49      Assessment & Plan: Pt is a 70 y.o. Caucasian male with  medical problems of Long-standing diabetes for over 15 years, coronary disease with MI in 2000 with angioplasty, CABG (3 vessel) at [facility] in 2005, defibrillator pacemaker placement, chronic systolic congestive heart failure with EF of 25% COPD, chronic kidney disease stage IV, BPH, renal mass followed at [facility], history of pancreatitis in the past, who was admitted to [facility] on 08/30/2016 for evaluation of epigastric abdominal pain, fever, confusion that started yesterday afternoon.  1. Acute renal failure on chronic kidney disease stage IV Baseline creatinine 2.7/GFR 23. Followed at [facility] Acute renal failure is likely secondary to prerenal state and possibly due to hypotension Patient was given volume resuscitation for hypotension and volume depletion. His blood pressure has improved. However, he is still requiring pressors. He is currently getting infusion of D5- normal saline as well as sodium bicarbonate. In light of patient's low EF of 25% and congestive heart failure, we will discontinue bicarbonate infusion. He can continue the D5 normal saline infusion for volume resuscitation  Avoid hypotension, nephrotoxins Maintain hemodynamic stability Avoid ibuprofen No acute indication of dialysis at present  2. Mild hyperkalemia Monitor closely  3. Acute pancreatitis Pain is resolved.  Will follow

## 2016-09-01 DIAGNOSIS — A4151 Sepsis due to Escherichia coli [E. coli]: Principal | ICD-10-CM

## 2016-09-01 LAB — BASIC METABOLIC PANEL
ANION GAP: 5 (ref 5–15)
BUN: 46 mg/dL — AB (ref 6–20)
CO2: 29 mmol/L (ref 22–32)
Calcium: 7.8 mg/dL — ABNORMAL LOW (ref 8.9–10.3)
Chloride: 104 mmol/L (ref 101–111)
Creatinine, Ser: 3.47 mg/dL — ABNORMAL HIGH (ref 0.61–1.24)
GFR calc Af Amer: 19 mL/min — ABNORMAL LOW (ref >60)
GFR calc non Af Amer: 16 mL/min — ABNORMAL LOW (ref >60)
GLUCOSE: 228 mg/dL — AB (ref 65–99)
POTASSIUM: 4.5 mmol/L (ref 3.5–5.1)
Sodium: 138 mmol/L (ref 135–145)

## 2016-09-01 LAB — CBC
HEMATOCRIT: 34.3 % — AB (ref 40.0–52.0)
Hemoglobin: 11.7 g/dL — ABNORMAL LOW (ref 13.0–18.0)
MCH: 32.2 pg (ref 26.0–34.0)
MCHC: 34.1 g/dL (ref 32.0–36.0)
MCV: 94.6 fL (ref 80.0–100.0)
Platelets: 74 10*3/uL — ABNORMAL LOW (ref 150–440)
RBC: 3.63 MIL/uL — ABNORMAL LOW (ref 4.40–5.90)
RDW: 14.4 % (ref 11.5–14.5)
WBC: 4.1 10*3/uL (ref 3.8–10.6)

## 2016-09-01 LAB — HEPATIC FUNCTION PANEL
ALT: 62 U/L (ref 17–63)
AST: 53 U/L — AB (ref 15–41)
Albumin: 3.1 g/dL — ABNORMAL LOW (ref 3.5–5.0)
Alkaline Phosphatase: 155 U/L — ABNORMAL HIGH (ref 38–126)
Bilirubin, Direct: 0.6 mg/dL — ABNORMAL HIGH (ref 0.1–0.5)
Indirect Bilirubin: 0.9 mg/dL (ref 0.3–0.9)
TOTAL PROTEIN: 5.8 g/dL — AB (ref 6.5–8.1)
Total Bilirubin: 1.5 mg/dL — ABNORMAL HIGH (ref 0.3–1.2)

## 2016-09-01 LAB — GLUCOSE, CAPILLARY
GLUCOSE-CAPILLARY: 122 mg/dL — AB (ref 65–99)
Glucose-Capillary: 111 mg/dL — ABNORMAL HIGH (ref 65–99)
Glucose-Capillary: 128 mg/dL — ABNORMAL HIGH (ref 65–99)
Glucose-Capillary: 170 mg/dL — ABNORMAL HIGH (ref 65–99)
Glucose-Capillary: 198 mg/dL — ABNORMAL HIGH (ref 65–99)
Glucose-Capillary: 212 mg/dL — ABNORMAL HIGH (ref 65–99)
Glucose-Capillary: 91 mg/dL (ref 65–99)

## 2016-09-01 LAB — URINE CULTURE: CULTURE: NO GROWTH

## 2016-09-01 LAB — LIPASE, BLOOD: LIPASE: 92 U/L — AB (ref 11–51)

## 2016-09-01 MED ORDER — MEROPENEM-SODIUM CHLORIDE 1 GM/50ML IV SOLR
1.0000 g | Freq: Two times a day (BID) | INTRAVENOUS | Status: DC
  Filled 2016-09-01: qty 50

## 2016-09-01 MED ORDER — PREGABALIN 75 MG PO CAPS
75.0000 mg | ORAL_CAPSULE | Freq: Every day | ORAL | Status: DC
  Administered 2016-09-01 – 2016-09-04 (×4): 75 mg via ORAL
  Filled 2016-09-01 (×4): qty 1

## 2016-09-01 MED ORDER — SODIUM CHLORIDE 0.9 % IV SOLN
500.0000 mg | Freq: Two times a day (BID) | INTRAVENOUS | Status: DC
  Administered 2016-09-01 – 2016-09-02 (×2): 500 mg via INTRAVENOUS
  Filled 2016-09-01 (×3): qty 0.5

## 2016-09-01 NOTE — Consult Note (Signed)
Patient with remarkable improvement in 24 hours.  He likely did flip a gallstone, given the rapid improvement in his lipase, bilirubin.  He was easily arousable and able to converse.  He is started on clear liquids.  I would recommend he take Actigal 300mg  bid to prevent repeat gall stone pancreatitis.  See outstanding notes from Dr. Winona LegatoVaickute and Dr. Dema SeverinMungal.

## 2016-09-01 NOTE — Progress Notes (Signed)
Subjective:   Patient is feeling better today Urine output 2000 cc Serum creatinine has improved down to 3.47 today  Objective:  Vital signs in last 24 hours:  Temp:  [97.3 F (36.3 C)-98.7 F (37.1 C)] 98.3 F (36.8 C) (12/14 0100) Pulse Rate:  [61-79] 74 (12/14 1100) Resp:  [10-23] 14 (12/14 1100) BP: (92-154)/(40-96) 118/50 (12/14 1100) SpO2:  [87 %-98 %] 97 % (12/14 1100)  Weight change:  Filed Weights   08/31/16 0003 08/31/16 0530  Weight: (!) 139.7 kg (308 lb) (!) 140.1 kg (308 lb 13.8 oz)    Intake/Output:    Intake/Output Summary (Last 24 hours) at 09/01/16 1129 Last data filed at 09/01/16 1100  Gross per 24 hour  Intake          2132.58 ml  Output             2600 ml  Net          -467.42 ml     Physical Exam: General: No acute distress, laying in the bed   HEENT Anicteric,  Neck Supple   Pulm/lungs Normal breathing effort, clear to auscultation bilaterally   CVS/Heart No rub or gallop   Abdomen:  Soft, nontender, nondistended   Extremities: Trace peripheral edema   Neurologic: Alert, oriented, mild tremors   Skin: No acute rashes    Foley in place        Basic Metabolic Panel:   Recent Labs Lab 08/31/16 0016 08/31/16 0701 08/31/16 1230 08/31/16 2011 09/01/16 0457  NA 137 136 135 135 138  K 5.2* 5.3* 5.1 4.1 4.5  CL 105 106 105 105 104  CO2 26 23 24 24 29   GLUCOSE 153* 108* 131* 255* 228*  BUN 45* 46* 52* 49* 46*  CREATININE 3.04* 3.50* 3.99* 3.95* 3.47*  CALCIUM 8.9 8.1* 7.7* 7.8* 7.8*     CBC:  Recent Labs Lab 08/31/16 0016 08/31/16 0701 09/01/16 0457  WBC 6.2 9.4 4.1  NEUTROABS 5.8  --   --   HGB 13.0 12.8* 11.7*  HCT 38.0* 37.5* 34.3*  MCV 94.3 94.9 94.6  PLT 93* 92* 74*      Microbiology:  Recent Results (from the past 720 hour(s))  Blood Culture (routine x 2)     Status: Abnormal (Preliminary result)   Collection Time: 08/31/16 12:16 AM  Result Value Ref Range Status   Specimen Description BLOOD RIGHT AC   Final   Special Requests BOTTLES DRAWN AEROBIC AND ANAEROBIC ANA11CC AER8CC  Final   Culture  Setup Time   Final    GRAM NEGATIVE RODS IN BOTH AEROBIC AND ANAEROBIC BOTTLES CRITICAL RESULT CALLED TO, READ BACK BY AND VERIFIED WITH: CHRISTINE KATSOUDAS 08/31/16 1202 SGD    Culture (A)  Final    ESCHERICHIA COLI SUSCEPTIBILITIES TO FOLLOW Performed at [facility]    Report Status PENDING  Incomplete  Blood Culture ID Panel (Reflexed)     Status: Abnormal   Collection Time: 08/31/16 12:16 AM  Result Value Ref Range Status   Enterococcus species NOT DETECTED NOT DETECTED Final   Listeria monocytogenes NOT DETECTED NOT DETECTED Final   Staphylococcus species NOT DETECTED NOT DETECTED Final   Staphylococcus aureus NOT DETECTED NOT DETECTED Final   Streptococcus species NOT DETECTED NOT DETECTED Final   Streptococcus agalactiae NOT DETECTED NOT DETECTED Final   Streptococcus pneumoniae NOT DETECTED NOT DETECTED Final   Streptococcus pyogenes NOT DETECTED NOT DETECTED Final   Acinetobacter baumannii NOT DETECTED NOT DETECTED Final  Enterobacteriaceae species DETECTED (A) NOT DETECTED Final    Comment: CRITICAL RESULT CALLED TO, READ BACK BY AND VERIFIED WITH: CHRISTINE KATSOUDAS 08/31/16 1202 SGD    Enterobacter cloacae complex NOT DETECTED NOT DETECTED Final   Escherichia coli DETECTED (A) NOT DETECTED Final    Comment: CRITICAL RESULT CALLED TO, READ BACK BY AND VERIFIED WITH: CHRISTINE KATSOUDAS 08/31/16 1202 SGD    Klebsiella oxytoca NOT DETECTED NOT DETECTED Final   Klebsiella pneumoniae NOT DETECTED NOT DETECTED Final   Proteus species NOT DETECTED NOT DETECTED Final   Serratia marcescens NOT DETECTED NOT DETECTED Final   Carbapenem resistance NOT DETECTED NOT DETECTED Final   Haemophilus influenzae NOT DETECTED NOT DETECTED Final   Neisseria meningitidis NOT DETECTED NOT DETECTED Final   Pseudomonas aeruginosa NOT DETECTED NOT DETECTED Final   Candida albicans NOT  DETECTED NOT DETECTED Final   Candida glabrata NOT DETECTED NOT DETECTED Final   Candida krusei NOT DETECTED NOT DETECTED Final   Candida parapsilosis NOT DETECTED NOT DETECTED Final   Candida tropicalis NOT DETECTED NOT DETECTED Final  Blood Culture (routine x 2)     Status: Abnormal (Preliminary result)   Collection Time: 08/31/16 12:17 AM  Result Value Ref Range Status   Specimen Description BLOOD LEFT AC  Final   Special Requests   Final    BOTTLES DRAWN AEROBIC AND ANAEROBIC AER11CC ANA10CC   Culture  Setup Time   Final    GRAM NEGATIVE RODS IN BOTH AEROBIC AND ANAEROBIC BOTTLES CRITICAL RESULT CALLED TO, READ BACK BY AND VERIFIED WITH: CHRISTINE KATSOUDAS 08/31/16 1202 SGD    Culture ESCHERICHIA COLI (A)  Final   Report Status PENDING  Incomplete  MRSA PCR Screening     Status: None   Collection Time: 08/31/16  5:48 AM  Result Value Ref Range Status   MRSA by PCR NEGATIVE NEGATIVE Final    Comment:        The GeneXpert MRSA Assay (FDA approved for NASAL specimens only), is one component of a comprehensive MRSA colonization surveillance program. It is not intended to diagnose MRSA infection nor to guide or monitor treatment for MRSA infections.     Coagulation Studies:  Recent Labs  08/31/16 0701  LABPROT 14.3  INR 1.11    Urinalysis:  Recent Labs  08/31/16 1246  COLORURINE AMBER*  LABSPEC 1.020  PHURINE 5.0  GLUCOSEU 50*  HGBUR NEGATIVE  BILIRUBINUR NEGATIVE  KETONESUR NEGATIVE  PROTEINUR 30*  NITRITE NEGATIVE  LEUKOCYTESUR NEGATIVE      Imaging: Ct Abdomen Pelvis Wo Contrast  Result Date: 08/31/2016 CLINICAL DATA:  Epigastric pain, fever, elevated lipase, status post cholecystectomy. EXAM: CT ABDOMEN AND PELVIS WITHOUT CONTRAST TECHNIQUE: Multidetector CT imaging of the abdomen and pelvis was performed following the standard protocol without IV contrast. COMPARISON:  02/17/2008 FINDINGS: Lower chest: Lung bases shows bilateral posterior or  atelectasis. Trace posterior pleural effusion. The study is limited without IV contrast. Hepatobiliary: Unenhanced liver shows no biliary ductal dilatation. The patient is status post cholecystectomy. Pancreas: There is subtle mild stranding of peripancreatic fat especially in the region of pancreatic head and pancreatic body. Mild pancreatitis cannot be excluded. Clinical correlation is necessary. Spleen: Unenhanced spleen measures 16.7 cm in length. Adrenals/Urinary Tract: No adrenal gland again noted bilateral thickened adrenal glands. No hydronephrosis or hydroureter. There is nonobstructive cortical calcification in midpole of the right kidney anteriorly measures 3.5 mm. Exophytic lesion upper pole medial aspect of the right kidney measures 1.3 cm. This may represent mild  complex cyst. Correlation with ultrasound or MRI is recommended. Exophytic cyst in midpole of the left kidney measures 1.3 cm. There is a cortical cyst in upper pole of the left kidney measures 1.5 cm. No calcified ureteral calculi are noted. Stomach/Bowel: There is no small bowel obstruction. Moderate stool noted throughout the colon. Vascular/Lymphatic: Atherosclerotic calcifications are noted splenic artery and hepatic artery. SMA calcifications are noted. Mild atherosclerotic calcification distal abdominal aorta and iliac arteries. No aortic aneurysm. There is a pathologic portacaval lymph node measures 2.4 cm. This might be reactive. Clinical correlation is necessary. Please see axial image 38. On the prior exam this measures 1.7 cm. Reproductive: Prostate gland seminal vesicles are unremarkable. No pelvic mass. The urinary bladder is unremarkable. Other: No ascites or free abdominal air. Small nonspecific bilateral inguinal lymph nodes are noted. Musculoskeletal: No destructive bony lesions are noted. There are degenerative changes thoracolumbar spine. IMPRESSION: 1. Limited study without IV contrast. There is mild stranding of  peripancreatic fat especially in the region of pancreatic head and body of the pancreas. Mild pancreatitis cannot be excluded. 2. Bilateral lung bases posterior of atelectasis. Trace pleural effusion. 3. Status post cholecystectomy. 4. Right nonobstructive nephrolithiasis. Probable mild complex cyst in upper pole medial aspect of the right kidney measures 1.3 cm. Further correlation with ultrasound and/or MRI is recommended. Left renal cysts are noted. No hydronephrosis or hydroureter. 5. There is enlarged lymph node in portal caval region measures 2.4 cm with mild progression from prior exam this may be reactive. Clinical correlation is necessary. 6. Moderate colonic stool. 7. Again noted thickening of adrenal glands bilaterally. 8. Degenerative changes thoracolumbar spine. 9. No small bowel obstruction. Electronically Signed   By: Natasha Mead M.D.   On: 08/31/2016 09:24   Ct Head Wo Contrast  Result Date: 08/31/2016 CLINICAL DATA:  70 y/o  M; altered mental status. EXAM: CT HEAD WITHOUT CONTRAST TECHNIQUE: Contiguous axial images were obtained from the base of the skull through the vertex without intravenous contrast. COMPARISON:  12/29/2005 CT head FINDINGS: Brain: No evidence of acute infarction, hemorrhage, hydrocephalus, extra-axial collection or mass lesion/mass effect. Mild chronic microvascular ischemic changes and parenchymal volume loss of the brain are progressed from the prior CT of the head. Lucency within the left hemi pons is compatible with an age-indeterminate lacunar infarct (series 2, image 11). Vascular: Calcific atherosclerosis of the cavernous internal carotid arteries and the vertebrobasilar system. Skull: Normal. Negative for fracture or focal lesion. Sinuses/Orbits: No acute finding. Other: None. IMPRESSION: 1. No evidence of large acute infarct, intracranial hemorrhage, or focal mass effect. 2. Mild chronic microvascular ischemic changes and parenchymal volume loss of the brain are  progressed from 2007. 3. Left hemi pons lucency compatible with age-indeterminate lacunar infarct. Electronically Signed   By: Mitzi Hansen M.D.   On: 08/31/2016 01:26   US Abdomen Complete  Result Date: 08/31/2016 CLINICAL DATA:  Abdominal pain EXAM: ABDOMEN ULTRASOUND COMPLETE COMPARISON:  CT abdomen and pelvis August 31, 2016 FINDINGS: Gallbladder: Surgically absent. Common bile duct: Diameter: 4 mm. There is no intrahepatic, common hepatic, or common bile duct dilatation. Liver: No focal lesion identified. Within normal limits in parenchymal echogenicity. IVC: No abnormality visualized. Pancreas: Visualized portion unremarkable. Much of the pancreas is obscured by gas. Spleen: Spleen measures 9.6 x 15.3 x 5.6 cm with a measures splenic volume of 428 cubic cm. No focal splenic lesions are evident. Right Kidney: Length: 11.2 cm. Echogenicity within normal limits. There is renal cortical thinning. No mass or  hydronephrosis visualized. Left Kidney: Length: 9.8 cm. Echogenicity within normal limits. There is renal cortical thinning. No hydronephrosis visualized. There is a hypoechoic but apparently noncystic mass arising from the upper pole of the left kidney measuring 2.0 x 2.1 x 1.8 cm. Abdominal aorta: No aneurysm visualized. Other findings: No demonstrable ascites. IMPRESSION: Hypoechoic but noncystic appearing mass arising from left kidney upper pole region. This mass measures 2.0 x 2.1 x 1.8 cm. Further evaluation with pre and post contrast MRI or CT should be considered. MRI is preferred in younger patients (due to lack of ionizing radiation) and for evaluating calcified lesion(s). Absent gallbladder. Prominent spleen without focal splenic lesion. Much of pancreas obscured by gas. Visualized portions of pancreas appear unremarkable. Each kidney shows renal cortical thinning, a finding that may be indicative of a degree of medical renal disease. The renal echogenicity is within normal limits  bilaterally. No obstructing foci identified in either kidney. Electronically Signed   By: Bretta Bang III M.D.   On: 08/31/2016 09:39   Dg Chest Port 1 View  Result Date: 08/31/2016 CLINICAL DATA:  Central line adjustment EXAM: PORTABLE CHEST 1 VIEW COMPARISON:  08/31/2016 FINDINGS: Right central line tip is in the SVC. No pneumothorax. 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 Prior CABG. No confluent opacities or overt edema. No effusions or acute bony abnormality. IMPRESSION: Right central line tip in the SVC.  No pneumothorax. Cardiomegaly, vascular congestion. Electronically Signed   By: Charlett Nose M.D.   On: 08/31/2016 13:09   Dg Chest Port 1 View  Result Date: 08/31/2016 CLINICAL DATA:  Central line placement. EXAM: PORTABLE CHEST 1 VIEW 12:29 p.m. COMPARISON:  08/31/2016 at 2:18 a.m. FINDINGS: There is a new right jugular vein catheter with the tip in the superior vena cava. No pneumothorax. Heart size and pulmonary vascularity are normal. Lungs are clear. AICD in place. CABG. IMPRESSION: Central line appears in good position. No pneumothorax. No active disease. Electronically Signed   By: Francene Boyers M.D.   On: 08/31/2016 12:45   Dg Chest Portable 1 View  Result Date: 08/31/2016 CLINICAL DATA:  70 y/o  M; dyspnea and fever. EXAM: PORTABLE CHEST 1 VIEW COMPARISON:  11/13/2009 chest radiograph FINDINGS: Stable enlarged cardiac silhouette. Two lead AICD. Stable obscuration of left heart border possibly representing postsurgical changes and/or a prominent epicardial fat pad. Moderate bronchitic markings. No focal consolidation. Post median sternotomy with multiple broken wires similar to prior study. IMPRESSION: Moderate bronchitic markings may represent bronchitis. No new focal consolidation. Electronically Signed   By: Mitzi Hansen M.D.   On: 08/31/2016 02:49     Medications:   . dextrose 50 mL/hr at 08/31/16 2026  . norepinephrine Stopped (09/01/16 0222)   . mouth  rinse  15 mL Mouth Rinse BID  . meropenem  1 g Intravenous Q12H  . mometasone-formoterol  2 puff Inhalation BID  .  morphine injection  1 mg Intravenous Once  . pantoprazole (PROTONIX) IV  40 mg Intravenous Q24H  . sodium chloride flush  3 mL Intravenous Q12H   acetaminophen **OR** acetaminophen, albuterol, ondansetron **OR** ondansetron (ZOFRAN) IV  Assessment/ Plan:  70 y.o. Caucasian male with  medical problems of Long-standing diabetes for over 15 years, coronary disease with MI in 2000 with angioplasty, CABG (3 vessel) at [facility] in 2005, defibrillator pacemaker placement, chronic systolic congestive heart failure with EF of 25% COPD, chronic kidney disease stage IV, BPH, renal mass followed [facility], history of pancreatitis in the past, who was  [facility] for evaluation of epigastric abdominal pain, fever, confusion that started yesterday afternoon.  1. Acute renal failure on chronic kidney disease stage IV Baseline creatinine 2.7/GFR 23. [facility] Acute renal failure is likely secondary to prerenal state and possibly due to hypotension Patient was given volume resuscitation for hypotension and volume depletion. His blood pressure has improved. However, he is still requiring pressors. He is currently getting infusion of D5- normal saline as well as sodium bicarbonate. In light of patient's low EF of 25% and congestive heart failure, we will discontinue bicarbonate infusion. He can continue the D5 normal saline infusion at a low rate until he has adequate oral intake  Avoid hypotension, nephrotoxins Maintain hemodynamic stability Avoid ibuprofen No acute indication of dialysis at present  2. Mild hyperkalemia Monitor closely  3. Acute pancreatitis Pain is resolved.  4. E Coli sepsis - Abx as per IM team  5. B/L Renal Masses - chronic issue, [facility]  6. Nephrolithiasis nonobstructive cortical calcification in midpole of the right kidney  anteriorly measures 3.5 mm - monitor   LOS: 1 Calena Salem 12/14/201711:29 AM

## 2016-09-01 NOTE — H&P (Addendum)
Brent Silva is an 70 y.o. male.   Chief Complaint: sepsis from ecoli HPI: 70 year old male with multiple medical issues admitted to the ICU for sepsis with Escherichia coli positive blood cultures as well as elevation of lipase and pancreatitis. The patient does have a history of end-stage renal disease not currently on dialysis, COPD hypertension CAD, previous MI, and previous pancreatitis. Of note the patient did have his gallbladder taken out in 2004 here at [facility]. The patient has had one other bout of pancreatitis since having the gallbladder removed. The patient states that a few days ago he began having pain in his epigastrium. The patient states it was severe pain that continued to increase and became 10/10 pain. The patient states that the pain did not move through to his back or move in any other area. Patient also states that he's had some constipation and some harder stools. He denies any nausea or vomiting however he does endorse some fever and some Reiger is in an nervous twitching. 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 Arthritis   . CAD (coronary artery disease)   . CHF (congestive heart failure) ([place])   . Chronic kidney disease   . COPD (chronic obstructive pulmonary disease) ([place])   . Diabetes mellitus without complication ([place])   . Hyperlipidemia   . Hypertension   . Myocardial infarction 2000  . PVD (peripheral vascular disease) ([place])     Past Surgical History:  Procedure Laterality Date  . CARDIAC DEFIBRILLATOR PLACEMENT  11/2014  . CORONARY ARTERY BYPASS GRAFT  2005  . FRACTURE SURGERY Left 1998   Screws in andkle  . JOINT REPLACEMENT Right 2005    Family History  Problem Relation Age of Onset  . Diabetes Brother   . Heart disease Brother   . Heart disease Father    Social History:  reports that  he quit smoking about 12 years ago. His smoking use included Cigarettes. He has a 50.00 pack-year smoking history. He has never used smokeless tobacco. He reports that he does not drink alcohol or use drugs.  Allergies:  Allergies  Allergen Reactions  . Sulfa Antibiotics Other (See Comments)    hyperkalemia  . Other Other (See Comments)    Agents that cause hyperkalemia  . Umeclidinium Other (See Comments)    Chest congestion  . Clarithromycin Hives  . Ramipril Cough  . Tiotropium Other (See Comments)    Chest congestion    Medications Prior to Admission  Medication Sig Dispense Refill  . acetaminophen (TYLENOL) 500 MG tablet Take 500 mg by mouth every 6 (six) hours as needed.    Marland Kitchen albuterol (VENTOLIN HFA) 108 (90 BASE) MCG/ACT inhaler Inhale 2 puffs into the lungs every 6 (six) hours as needed for wheezing.     Marland Kitchen aspirin 81 MG tablet Take 81 mg by mouth daily.     Marland Kitchen atorvastatin (LIPITOR) 40 MG tablet Take 40 mg by mouth at bedtime.     Marland Kitchen doxazosin (CARDURA) 2 MG tablet Take 2 mg by mouth daily.     . Fluticasone-Salmeterol (ADVAIR DISKUS) 500-50 MCG/DOSE AEPB Inhale 1 puff into the lungs 2 (two) times daily.     Marland Kitchen glipiZIDE (GLUCOTROL) 10 MG tablet Take 20 mg by mouth 2 (two) times daily before a meal.     . hydrALAZINE (  APRESOLINE) 25 MG tablet Take 25 mg by mouth 3 (three) times daily.    . Insulin Glargine (LANTUS SOLOSTAR) 100 UNIT/ML Solostar Pen Inject 70 Units into the skin at bedtime.     . isosorbide dinitrate (ISORDIL) 20 MG tablet Take 20 mg by mouth 3 (three) times daily.    Marland Kitchen loratadine (CLARITIN) 10 MG tablet Take 10 mg by mouth daily as needed.     . metoprolol succinate (TOPROL-XL) 100 MG 24 hr tablet Take 100 mg by mouth daily.     . nitroGLYCERIN (NITROSTAT) 0.4 MG SL tablet Place 0.4 mg under the tongue every 5 (five) minutes as needed for chest pain.     Marland Kitchen omeprazole (PRILOSEC) 20 MG capsule Take 20 mg by mouth 2 (two) times daily before a meal.     .  Polyethylene Glycol 3350 POWD Take 1 Dose by mouth daily as needed.     . pregabalin (LYRICA) 75 MG capsule One po QD for one week, then one po Bid for one week, then one po tid    . ranolazine (RANEXA) 1000 MG SR tablet Take 1,000 mg by mouth 2 (two) times daily.     . tamsulosin (FLOMAX) 0.4 MG CAPS capsule Take 0.4 mg by mouth daily.     Marland Kitchen torsemide (DEMADEX) 20 MG tablet Take 20 mg by mouth daily.    Marland Kitchen losartan (COZAAR) 25 MG tablet Take 12.5 mg by mouth daily.     . nitroGLYCERIN (NITRODUR - DOSED IN MG/24 HR) 0.4 mg/hr patch Place onto the skin.      Results for orders placed or performed during the hospital encounter of 08/30/16 (from the past 48 hour(s))  Comprehensive metabolic panel     Status: Abnormal   Collection Time: 08/31/16 12:16 AM  Result Value Ref Range   Sodium 137 135 - 145 mmol/L   Potassium 5.2 (H) 3.5 - 5.1 mmol/L   Chloride 105 101 - 111 mmol/L   CO2 26 22 - 32 mmol/L   Glucose, Bld 153 (H) 65 - 99 mg/dL   BUN 45 (H) 6 - 20 mg/dL   Creatinine, Ser 3.04 (H) 0.61 - 1.24 mg/dL   Calcium 8.9 8.9 - 10.3 mg/dL   Total Protein 7.3 6.5 - 8.1 g/dL   Albumin 4.1 3.5 - 5.0 g/dL   AST 133 (H) 15 - 41 U/L   ALT 87 (H) 17 - 63 U/L   Alkaline Phosphatase 232 (H) 38 - 126 U/L   Total Bilirubin 3.5 (H) 0.3 - 1.2 mg/dL   GFR calc non Af Amer 19 (L) >60 mL/min   GFR calc Af Amer 22 (L) >60 mL/min    Comment: (NOTE) The eGFR has been calculated using the CKD EPI equation. This calculation has not been validated in all clinical situations. eGFR's persistently <60 mL/min signify possible Chronic Kidney Disease.    Anion gap 6 5 - 15  CBC WITH DIFFERENTIAL     Status: Abnormal   Collection Time: 08/31/16 12:16 AM  Result Value Ref Range   WBC 6.2 3.8 - 10.6 K/uL   RBC 4.03 (L) 4.40 - 5.90 MIL/uL   Hemoglobin 13.0 13.0 - 18.0 g/dL   HCT 38.0 (L) 40.0 - 52.0 %   MCV 94.3 80.0 - 100.0 fL   MCH 32.3 26.0 - 34.0 pg   MCHC 34.2 32.0 - 36.0 g/dL   RDW 14.0 11.5 - 14.5 %    Platelets 93 (L) 150 - 440 K/uL  Neutrophils Relative % 94 %   Neutro Abs 5.8 1.4 - 6.5 K/uL   Lymphocytes Relative 2 %   Lymphs Abs 0.1 (L) 1.0 - 3.6 K/uL   Monocytes Relative 3 %   Monocytes Absolute 0.2 0.2 - 1.0 K/uL   Eosinophils Relative 1 %   Eosinophils Absolute 0.1 0 - 0.7 K/uL   Basophils Relative 0 %   Basophils Absolute 0.0 0 - 0.1 K/uL  Blood Culture (routine x 2)     Status: Abnormal (Preliminary result)   Collection Time: 08/31/16 12:16 AM  Result Value Ref Range   Specimen Description BLOOD RIGHT AC    Special Requests BOTTLES DRAWN AEROBIC AND ANAEROBIC ANA11CC [place]    Culture  Setup Time      GRAM NEGATIVE RODS IN BOTH AEROBIC AND ANAEROBIC BOTTLES CRITICAL RESULT CALLED TO, READ BACK BY AND VERIFIED WITH: CHRISTINE KATSOUDAS 08/31/16 1202 SGD    Culture (A)     ESCHERICHIA COLI SUSCEPTIBILITIES TO FOLLOW Performed at [facility]    Report Status PENDING   Lactic acid, plasma     Status: None   Collection Time: 08/31/16 12:16 AM  Result Value Ref Range   Lactic Acid, Venous 1.9 0.5 - 1.9 mmol/L  Ammonia     Status: Abnormal   Collection Time: 08/31/16 12:16 AM  Result Value Ref Range   Ammonia 42 (H) 9 - 35 umol/L  Lipase, blood     Status: Abnormal   Collection Time: 08/31/16 12:16 AM  Result Value Ref Range   Lipase 1,690 (H) 11 - 51 U/L    Comment: RESULT CONFIRMED BY MANUAL DILUTION.MSS  CK     Status: None   Collection Time: 08/31/16 12:16 AM  Result Value Ref Range   Total CK 83 49 - 397 U/L  Blood Culture ID Panel (Reflexed)     Status: Abnormal   Collection Time: 08/31/16 12:16 AM  Result Value Ref Range   Enterococcus species NOT DETECTED NOT DETECTED   Listeria monocytogenes NOT DETECTED NOT DETECTED   Staphylococcus species NOT DETECTED NOT DETECTED   Staphylococcus aureus NOT DETECTED NOT DETECTED   Streptococcus species NOT DETECTED NOT DETECTED   Streptococcus agalactiae NOT DETECTED NOT DETECTED   Streptococcus  pneumoniae NOT DETECTED NOT DETECTED   Streptococcus pyogenes NOT DETECTED NOT DETECTED   Acinetobacter baumannii NOT DETECTED NOT DETECTED   Enterobacteriaceae species DETECTED (A) NOT DETECTED    Comment: CRITICAL RESULT CALLED TO, READ BACK BY AND VERIFIED WITH: CHRISTINE KATSOUDAS 08/31/16 1202 SGD    Enterobacter cloacae complex NOT DETECTED NOT DETECTED   Escherichia coli DETECTED (A) NOT DETECTED    Comment: CRITICAL RESULT CALLED TO, READ BACK BY AND VERIFIED WITH: CHRISTINE KATSOUDAS 08/31/16 1202 SGD    Klebsiella oxytoca NOT DETECTED NOT DETECTED   Klebsiella pneumoniae NOT DETECTED NOT DETECTED   Proteus species NOT DETECTED NOT DETECTED   Serratia marcescens NOT DETECTED NOT DETECTED   Carbapenem resistance NOT DETECTED NOT DETECTED   Haemophilus influenzae NOT DETECTED NOT DETECTED   Neisseria meningitidis NOT DETECTED NOT DETECTED   Pseudomonas aeruginosa NOT DETECTED NOT DETECTED   Candida albicans NOT DETECTED NOT DETECTED   Candida glabrata NOT DETECTED NOT DETECTED   Candida krusei NOT DETECTED NOT DETECTED   Candida parapsilosis NOT DETECTED NOT DETECTED   Candida tropicalis NOT DETECTED NOT DETECTED  Blood Culture (routine x 2)     Status: Abnormal (Preliminary result)   Collection Time: 08/31/16 12:17 AM  Result  Value Ref Range   Specimen Description BLOOD LEFT AC    Special Requests      BOTTLES DRAWN AEROBIC AND ANAEROBIC AER11CC ANA10CC   Culture  Setup Time      GRAM NEGATIVE RODS IN BOTH AEROBIC AND ANAEROBIC BOTTLES CRITICAL RESULT CALLED TO, READ BACK BY AND VERIFIED WITH: CHRISTINE KATSOUDAS 08/31/16 1202 SGD    Culture ESCHERICHIA COLI (A)    Report Status PENDING   Lactic acid, plasma     Status: Abnormal   Collection Time: 08/31/16  4:05 AM  Result Value Ref Range   Lactic Acid, Venous 2.0 (HH) 0.5 - 1.9 mmol/L    Comment: CRITICAL RESULT CALLED TO, READ BACK BY AND VERIFIED WITH RACHEL HAYDEN ON 08/31/16 AT 0459 QSD   MRSA PCR Screening      Status: None   Collection Time: 08/31/16  5:48 AM  Result Value Ref Range   MRSA by PCR NEGATIVE NEGATIVE    Comment:        The GeneXpert MRSA Assay (FDA approved for NASAL specimens only), is one component of a comprehensive MRSA colonization surveillance program. It is not intended to diagnose MRSA infection nor to guide or monitor treatment for MRSA infections.   Glucose, capillary     Status: Abnormal   Collection Time: 08/31/16  5:52 AM  Result Value Ref Range   Glucose-Capillary 49 (L) 65 - 99 mg/dL  Glucose, capillary     Status: Abnormal   Collection Time: 08/31/16  6:42 AM  Result Value Ref Range   Glucose-Capillary 120 (H) 65 - 99 mg/dL  Lipase, blood     Status: Abnormal   Collection Time: 08/31/16  7:01 AM  Result Value Ref Range   Lipase 1,367 (H) 11 - 51 U/L    Comment: RESULT CONFIRMED BY MANUAL DILUTION QSD/DAS  Lactic acid, plasma     Status: None   Collection Time: 08/31/16  7:01 AM  Result Value Ref Range   Lactic Acid, Venous 1.2 0.5 - 1.9 mmol/L  Lipid panel     Status: Abnormal   Collection Time: 08/31/16  7:01 AM  Result Value Ref Range   Cholesterol 99 0 - 200 mg/dL   Triglycerides 113 <150 mg/dL   HDL 17 (L) >40 mg/dL   Total CHOL/HDL Ratio 5.8 RATIO   VLDL 23 0 - 40 mg/dL   LDL Cholesterol 59 0 - 99 mg/dL    Comment:        Total Cholesterol/HDL:CHD Risk Coronary Heart Disease Risk Table                     Men   Women  1/2 Average Risk   3.4   3.3  Average Risk       5.0   4.4  2 X Average Risk   9.6   7.1  3 X Average Risk  23.4   11.0        Use the calculated Patient Ratio above and the CHD Risk Table to determine the patient's CHD Risk.        ATP III CLASSIFICATION (LDL):  <100     mg/dL   Optimal  100-129  mg/dL   Near or Above                    Optimal  130-159  mg/dL   Borderline  160-189  mg/dL   High  >190     mg/dL   Very  High   CBC     Status: Abnormal   Collection Time: 08/31/16  7:01 AM  Result Value Ref  Range   WBC 9.4 3.8 - 10.6 K/uL   RBC 3.95 (L) 4.40 - 5.90 MIL/uL   Hemoglobin 12.8 (L) 13.0 - 18.0 g/dL   HCT 37.5 (L) 40.0 - 52.0 %   MCV 94.9 80.0 - 100.0 fL   MCH 32.5 26.0 - 34.0 pg   MCHC 34.2 32.0 - 36.0 g/dL   RDW 14.2 11.5 - 14.5 %   Platelets 92 (L) 150 - 440 K/uL  Comprehensive metabolic panel     Status: Abnormal   Collection Time: 08/31/16  7:01 AM  Result Value Ref Range   Sodium 136 135 - 145 mmol/L   Potassium 5.3 (H) 3.5 - 5.1 mmol/L   Chloride 106 101 - 111 mmol/L   CO2 23 22 - 32 mmol/L   Glucose, Bld 108 (H) 65 - 99 mg/dL   BUN 46 (H) 6 - 20 mg/dL   Creatinine, Ser 3.50 (H) 0.61 - 1.24 mg/dL   Calcium 8.1 (L) 8.9 - 10.3 mg/dL   Total Protein 6.4 (L) 6.5 - 8.1 g/dL   Albumin 3.6 3.5 - 5.0 g/dL   AST 109 (H) 15 - 41 U/L   ALT 87 (H) 17 - 63 U/L   Alkaline Phosphatase 194 (H) 38 - 126 U/L   Total Bilirubin 3.8 (H) 0.3 - 1.2 mg/dL   GFR calc non Af Amer 16 (L) >60 mL/min   GFR calc Af Amer 19 (L) >60 mL/min    Comment: (NOTE) The eGFR has been calculated using the CKD EPI equation. This calculation has not been validated in all clinical situations. eGFR's persistently <60 mL/min signify possible Chronic Kidney Disease.    Anion gap 7 5 - 15  Protime-INR     Status: None   Collection Time: 08/31/16  7:01 AM  Result Value Ref Range   Prothrombin Time 14.3 11.4 - 15.2 seconds   INR 1.11   Glucose, capillary     Status: Abnormal   Collection Time: 08/31/16  7:19 AM  Result Value Ref Range   Glucose-Capillary 102 (H) 65 - 99 mg/dL  Glucose, capillary     Status: None   Collection Time: 08/31/16 11:26 AM  Result Value Ref Range   Glucose-Capillary 68 65 - 99 mg/dL  Basic metabolic panel     Status: Abnormal   Collection Time: 08/31/16 12:30 PM  Result Value Ref Range   Sodium 135 135 - 145 mmol/L   Potassium 5.1 3.5 - 5.1 mmol/L    Comment: HEMOLYSIS AT THIS LEVEL MAY AFFECT RESULT   Chloride 105 101 - 111 mmol/L   CO2 24 22 - 32 mmol/L   Glucose,  Bld 131 (H) 65 - 99 mg/dL   BUN 52 (H) 6 - 20 mg/dL   Creatinine, Ser 3.99 (H) 0.61 - 1.24 mg/dL   Calcium 7.7 (L) 8.9 - 10.3 mg/dL   GFR calc non Af Amer 14 (L) >60 mL/min   GFR calc Af Amer 16 (L) >60 mL/min    Comment: (NOTE) The eGFR has been calculated using the CKD EPI equation. This calculation has not been validated in all clinical situations. eGFR's persistently <60 mL/min signify possible Chronic Kidney Disease.    Anion gap 6 5 - 15  Urine culture     Status: None   Collection Time: 08/31/16 12:46 PM  Result Value Ref Range  Specimen Description URINE, RANDOM    Special Requests NONE    Culture NO GROWTH Performed at [facility]     Report Status 09/01/2016 FINAL   Urinalysis, Complete w Microscopic     Status: Abnormal   Collection Time: 08/31/16 12:46 PM  Result Value Ref Range   Color, Urine AMBER (A) YELLOW    Comment: BIOCHEMICALS MAY BE AFFECTED BY COLOR   APPearance CLEAR (A) CLEAR   Specific Gravity, Urine 1.020 1.005 - 1.030   pH 5.0 5.0 - 8.0   Glucose, UA 50 (A) NEGATIVE mg/dL   Hgb urine dipstick NEGATIVE NEGATIVE   Bilirubin Urine NEGATIVE NEGATIVE   Ketones, ur NEGATIVE NEGATIVE mg/dL   Protein, ur 30 (A) NEGATIVE mg/dL   Nitrite NEGATIVE NEGATIVE   Leukocytes, UA NEGATIVE NEGATIVE   RBC / HPF 0-5 0 - 5 RBC/hpf   WBC, UA 0-5 0 - 5 WBC/hpf   Bacteria, UA RARE (A) NONE SEEN   Squamous Epithelial / LPF 0-5 (A) NONE SEEN   WBC Clumps PRESENT    Granular Casts, UA PRESENT   Bilirubin, direct     Status: Abnormal   Collection Time: 08/31/16 12:46 PM  Result Value Ref Range   Bilirubin, Direct 1.6 (H) 0.1 - 0.5 mg/dL    Comment: HEMOLYSIS AT THIS LEVEL MAY AFFECT RESULT  Glucose, capillary     Status: None   Collection Time: 08/31/16  2:18 PM  Result Value Ref Range   Glucose-Capillary 76 65 - 99 mg/dL  Glucose, capillary     Status: Abnormal   Collection Time: 08/31/16  5:04 PM  Result Value Ref Range   Glucose-Capillary 32 (LL) 65 -  99 mg/dL   Comment 1 Repeat Test   Glucose, capillary     Status: Abnormal   Collection Time: 08/31/16  5:07 PM  Result Value Ref Range   Glucose-Capillary 33 (LL) 65 - 99 mg/dL   Comment 1 Notify RN   Glucose, capillary     Status: None   Collection Time: 08/31/16  6:18 PM  Result Value Ref Range   Glucose-Capillary 79 65 - 99 mg/dL  Glucose, capillary     Status: Abnormal   Collection Time: 08/31/16  7:40 PM  Result Value Ref Range   Glucose-Capillary 60 (L) 65 - 99 mg/dL   Comment 1 Notify RN   Comprehensive metabolic panel     Status: Abnormal   Collection Time: 08/31/16  8:11 PM  Result Value Ref Range   Sodium 135 135 - 145 mmol/L   Potassium 4.1 3.5 - 5.1 mmol/L   Chloride 105 101 - 111 mmol/L   CO2 24 22 - 32 mmol/L   Glucose, Bld 255 (H) 65 - 99 mg/dL   BUN 49 (H) 6 - 20 mg/dL   Creatinine, Ser 3.95 (H) 0.61 - 1.24 mg/dL   Calcium 7.8 (L) 8.9 - 10.3 mg/dL   Total Protein 6.0 (L) 6.5 - 8.1 g/dL   Albumin 3.3 (L) 3.5 - 5.0 g/dL   AST 65 (H) 15 - 41 U/L   ALT 69 (H) 17 - 63 U/L   Alkaline Phosphatase 162 (H) 38 - 126 U/L   Total Bilirubin 2.3 (H) 0.3 - 1.2 mg/dL   GFR calc non Af Amer 14 (L) >60 mL/min   GFR calc Af Amer 16 (L) >60 mL/min    Comment: (NOTE) The eGFR has been calculated using the CKD EPI equation. This calculation has not been validated in all  clinical situations. eGFR's persistently <60 mL/min signify possible Chronic Kidney Disease.    Anion gap 6 5 - 15  Glucose, capillary     Status: Abnormal   Collection Time: 08/31/16  8:33 PM  Result Value Ref Range   Glucose-Capillary 137 (H) 65 - 99 mg/dL   Comment 1 Notify RN   Glucose, capillary     Status: None   Collection Time: 08/31/16 10:15 PM  Result Value Ref Range   Glucose-Capillary 84 65 - 99 mg/dL   Comment 1 Notify RN   Glucose, capillary     Status: None   Collection Time: 08/31/16 11:56 PM  Result Value Ref Range   Glucose-Capillary 76 65 - 99 mg/dL   Comment 1 Notify RN    Glucose, capillary     Status: None   Collection Time: 09/01/16  1:56 AM  Result Value Ref Range   Glucose-Capillary 91 65 - 99 mg/dL   Comment 1 Notify RN   Lipase, blood     Status: Abnormal   Collection Time: 09/01/16  4:57 AM  Result Value Ref Range   Lipase 92 (H) 11 - 51 U/L  CBC     Status: Abnormal   Collection Time: 09/01/16  4:57 AM  Result Value Ref Range   WBC 4.1 3.8 - 10.6 K/uL   RBC 3.63 (L) 4.40 - 5.90 MIL/uL   Hemoglobin 11.7 (L) 13.0 - 18.0 g/dL   HCT 34.3 (L) 40.0 - 52.0 %   MCV 94.6 80.0 - 100.0 fL   MCH 32.2 26.0 - 34.0 pg   MCHC 34.1 32.0 - 36.0 g/dL   RDW 14.4 11.5 - 14.5 %   Platelets 74 (L) 150 - 440 K/uL  Basic metabolic panel     Status: Abnormal   Collection Time: 09/01/16  4:57 AM  Result Value Ref Range   Sodium 138 135 - 145 mmol/L   Potassium 4.5 3.5 - 5.1 mmol/L   Chloride 104 101 - 111 mmol/L   CO2 29 22 - 32 mmol/L   Glucose, Bld 228 (H) 65 - 99 mg/dL   BUN 46 (H) 6 - 20 mg/dL   Creatinine, Ser 3.47 (H) 0.61 - 1.24 mg/dL   Calcium 7.8 (L) 8.9 - 10.3 mg/dL   GFR calc non Af Amer 16 (L) >60 mL/min   GFR calc Af Amer 19 (L) >60 mL/min    Comment: (NOTE) The eGFR has been calculated using the CKD EPI equation. This calculation has not been validated in all clinical situations. eGFR's persistently <60 mL/min signify possible Chronic Kidney Disease.    Anion gap 5 5 - 15  Hepatic function panel     Status: Abnormal   Collection Time: 09/01/16  4:57 AM  Result Value Ref Range   Total Protein 5.8 (L) 6.5 - 8.1 g/dL   Albumin 3.1 (L) 3.5 - 5.0 g/dL   AST 53 (H) 15 - 41 U/L   ALT 62 17 - 63 U/L   Alkaline Phosphatase 155 (H) 38 - 126 U/L   Total Bilirubin 1.5 (H) 0.3 - 1.2 mg/dL   Bilirubin, Direct 0.6 (H) 0.1 - 0.5 mg/dL   Indirect Bilirubin 0.9 0.3 - 0.9 mg/dL  Glucose, capillary     Status: Abnormal   Collection Time: 09/01/16  6:01 AM  Result Value Ref Range   Glucose-Capillary 111 (H) 65 - 99 mg/dL   Comment 1 Notify RN    Glucose, capillary     Status: Abnormal  Collection Time: 09/01/16  7:31 AM  Result Value Ref Range   Glucose-Capillary 128 (H) 65 - 99 mg/dL  Glucose, capillary     Status: Abnormal   Collection Time: 09/01/16 11:30 AM  Result Value Ref Range   Glucose-Capillary 122 (H) 65 - 99 mg/dL  Glucose, capillary     Status: Abnormal   Collection Time: 09/01/16  4:25 PM  Result Value Ref Range   Glucose-Capillary 170 (H) 65 - 99 mg/dL   Ct Abdomen Pelvis Wo Contrast  Result Date: 08/31/2016 CLINICAL DATA:  Epigastric pain, fever, elevated lipase, status post cholecystectomy. EXAM: CT ABDOMEN AND PELVIS WITHOUT CONTRAST TECHNIQUE: Multidetector CT imaging of the abdomen and pelvis was performed following the standard protocol without IV contrast. COMPARISON:  02/17/2008 FINDINGS: Lower chest: Lung bases shows bilateral posterior or atelectasis. Trace posterior pleural effusion. The study is limited without IV contrast. Hepatobiliary: Unenhanced liver shows no biliary ductal dilatation. The patient is status post cholecystectomy. Pancreas: There is subtle mild stranding of peripancreatic fat especially in the region of pancreatic head and pancreatic body. Mild pancreatitis cannot be excluded. Clinical correlation is necessary. Spleen: Unenhanced spleen measures 16.7 cm in length. Adrenals/Urinary Tract: No adrenal gland again noted bilateral thickened adrenal glands. No hydronephrosis or hydroureter. There is nonobstructive cortical calcification in midpole of the right kidney anteriorly measures 3.5 mm. Exophytic lesion upper pole medial aspect of the right kidney measures 1.3 cm. This may represent mild complex cyst. Correlation with ultrasound or MRI is recommended. Exophytic cyst in midpole of the left kidney measures 1.3 cm. There is a cortical cyst in upper pole of the left kidney measures 1.5 cm. No calcified ureteral calculi are noted. Stomach/Bowel: There is no small bowel obstruction. Moderate  stool noted throughout the colon. Vascular/Lymphatic: Atherosclerotic calcifications are noted splenic artery and hepatic artery. SMA calcifications are noted. Mild atherosclerotic calcification distal abdominal aorta and iliac arteries. No aortic aneurysm. There is a pathologic portacaval lymph node measures 2.4 cm. This might be reactive. Clinical correlation is necessary. Please see axial image 38. On the prior exam this measures 1.7 cm. Reproductive: Prostate gland seminal vesicles are unremarkable. No pelvic mass. The urinary bladder is unremarkable. Other: No ascites or free abdominal air. Small nonspecific bilateral inguinal lymph nodes are noted. Musculoskeletal: No destructive bony lesions are noted. There are degenerative changes thoracolumbar spine. IMPRESSION: 1. Limited study without IV contrast. There is mild stranding of peripancreatic fat especially in the region of pancreatic head and body of the pancreas. Mild pancreatitis cannot be excluded. 2. Bilateral lung bases posterior of atelectasis. Trace pleural effusion. 3. Status post cholecystectomy. 4. Right nonobstructive nephrolithiasis. Probable mild complex cyst in upper pole medial aspect of the right kidney measures 1.3 cm. Further correlation with ultrasound and/or MRI is recommended. Left renal cysts are noted. No hydronephrosis or hydroureter. 5. There is enlarged lymph node in portal caval region measures 2.4 cm with mild progression from prior exam this may be reactive. Clinical correlation is necessary. 6. Moderate colonic stool. 7. Again noted thickening of adrenal glands bilaterally. 8. Degenerative changes thoracolumbar spine. 9. No small bowel obstruction. Electronically Signed   By: Lahoma Crocker M.D.   On: 08/31/2016 09:24   Ct Head Wo Contrast  Result Date: 08/31/2016 CLINICAL DATA:  71 y/o  M; altered mental status. EXAM: CT HEAD WITHOUT CONTRAST TECHNIQUE: Contiguous axial images were obtained from the base of the skull through  the vertex without intravenous contrast. COMPARISON:  12/29/2005 CT head FINDINGS: Brain:  No evidence of acute infarction, hemorrhage, hydrocephalus, extra-axial collection or mass lesion/mass effect. Mild chronic microvascular ischemic changes and parenchymal volume loss of the brain are progressed from the prior CT of the head. Lucency within the left hemi pons is compatible with an age-indeterminate lacunar infarct (series 2, image 11). Vascular: Calcific atherosclerosis of the cavernous internal carotid arteries and the vertebrobasilar system. Skull: Normal. Negative for fracture or focal lesion. Sinuses/Orbits: No acute finding. Other: None. IMPRESSION: 1. No evidence of large acute infarct, intracranial hemorrhage, or focal mass effect. 2. Mild chronic microvascular ischemic changes and parenchymal volume loss of the brain are progressed from 2007. 3. Left hemi pons lucency compatible with age-indeterminate lacunar infarct. Electronically Signed   By: Kristine Garbe M.D.   On: 08/31/2016 01:26   US Abdomen Complete  Result Date: 08/31/2016 CLINICAL DATA:  Abdominal pain EXAM: ABDOMEN ULTRASOUND COMPLETE COMPARISON:  CT abdomen and pelvis August 31, 2016 FINDINGS: Gallbladder: Surgically absent. Common bile duct: Diameter: 4 mm. There is no intrahepatic, common hepatic, or common bile duct dilatation. Liver: No focal lesion identified. Within normal limits in parenchymal echogenicity. IVC: No abnormality visualized. Pancreas: Visualized portion unremarkable. Much of the pancreas is obscured by gas. Spleen: Spleen measures 9.6 x 15.3 x 5.6 cm with a measures splenic volume of 428 cubic cm. No focal splenic lesions are evident. Right Kidney: Length: 11.2 cm. Echogenicity within normal limits. There is renal cortical thinning. No mass or hydronephrosis visualized. Left Kidney: Length: 9.8 cm. Echogenicity within normal limits. There is renal cortical thinning. No hydronephrosis visualized. There  is a hypoechoic but apparently noncystic mass arising from the upper pole of the left kidney measuring 2.0 x 2.1 x 1.8 cm. Abdominal aorta: No aneurysm visualized. Other findings: No demonstrable ascites. IMPRESSION: Hypoechoic but noncystic appearing mass arising from left kidney upper pole region. This mass measures 2.0 x 2.1 x 1.8 cm. Further evaluation with pre and post contrast MRI or CT should be considered. MRI is preferred in younger patients (due to lack of ionizing radiation) and for evaluating calcified lesion(s). Absent gallbladder. Prominent spleen without focal splenic lesion. Much of pancreas obscured by gas. Visualized portions of pancreas appear unremarkable. Each kidney shows renal cortical thinning, a finding that may be indicative of a degree of medical renal disease. The renal echogenicity is within normal limits bilaterally. No obstructing foci identified in either kidney. Electronically Signed   By: Lowella Grip III M.D.   On: 08/31/2016 09:39   Dg Chest Port 1 View  Result Date: 08/31/2016 CLINICAL DATA:  Central line adjustment EXAM: PORTABLE CHEST 1 VIEW COMPARISON:  08/31/2016 FINDINGS: Right central line tip is in the SVC. No pneumothorax. 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 Prior CABG. No confluent opacities or overt edema. No effusions or acute bony abnormality. IMPRESSION: Right central line tip in the SVC.  No pneumothorax. Cardiomegaly, vascular congestion. Electronically Signed   By: Rolm Baptise M.D.   On: 08/31/2016 13:09   Dg Chest Port 1 View  Result Date: 08/31/2016 CLINICAL DATA:  Central line placement. EXAM: PORTABLE CHEST 1 VIEW 12:29 p.m. COMPARISON:  08/31/2016 at 2:18 a.m. FINDINGS: There is a new right jugular vein catheter with the tip in the superior vena cava. No pneumothorax. Heart size and pulmonary vascularity are normal. Lungs are clear. AICD in place. CABG. IMPRESSION: Central line appears in good position. No pneumothorax. No active disease.  Electronically Signed   By: Lorriane Shire M.D.   On: 08/31/2016 12:45  Dg Chest Portable 1 View  Result Date: 08/31/2016 CLINICAL DATA:  70 y/o  M; dyspnea and fever. EXAM: PORTABLE CHEST 1 VIEW COMPARISON:  11/13/2009 chest radiograph FINDINGS: Stable enlarged cardiac silhouette. Two lead AICD. Stable obscuration of left heart border possibly representing postsurgical changes and/or a prominent epicardial fat pad. Moderate bronchitic markings. No focal consolidation. Post median sternotomy with multiple broken wires similar to prior study. IMPRESSION: Moderate bronchitic markings may represent bronchitis. No new focal consolidation. Electronically Signed   By: Kristine Garbe M.D.   On: 08/31/2016 02:49    Review of Systems  Constitutional: Positive for chills, fever and malaise/fatigue. Negative for diaphoresis and weight loss.  HENT: Negative for congestion and sore throat.   Respiratory: Negative for cough, sputum production and shortness of breath.   Cardiovascular: Negative for chest pain and leg swelling.  Gastrointestinal: Positive for abdominal pain and constipation. Negative for blood in stool, diarrhea, melena, nausea and vomiting.  Genitourinary: Negative for dysuria, frequency and hematuria.  Musculoskeletal: Negative for back pain and joint pain.  Skin: Negative for itching and rash.  Neurological: Negative for dizziness, loss of consciousness and weakness.  Psychiatric/Behavioral: Negative for depression. The patient is not nervous/anxious.   All other systems reviewed and are negative.   Blood pressure (!) 113/49, pulse 70, temperature 98.3 F (36.8 C), temperature source Oral, resp. rate 15, height 5' 11"  (1.803 m), weight (!) 308 lb 13.8 oz (140.1 kg), SpO2 95 %. Physical Exam  Vitals reviewed. Constitutional: He appears well-developed and well-nourished. No distress.  HENT:  Head: Normocephalic and atraumatic.  Right Ear: External ear normal.  Left Ear:  External ear normal.  Nose: Nose normal.  Port dentation with some ulcers along the left lateral border with pain in the anterior teeth  Eyes: Conjunctivae and EOM are normal. Pupils are equal, round, and reactive to light. No scleral icterus.  Neck: Normal range of motion. Neck supple. No tracheal deviation present.  Cardiovascular: Normal rate, regular rhythm and normal heart sounds.  Exam reveals no gallop.   No murmur heard. Respiratory: Effort normal and breath sounds normal. No respiratory distress. He has no wheezes. He has no rales.  GI: Soft. He exhibits no distension. There is no tenderness. There is no rebound.  Obese well-healed scars, very small reducible umbilical hernia, no current pain in the epigastrium or right upper quadrant.  Musculoskeletal: Normal range of motion. He exhibits edema. He exhibits no tenderness or deformity.  Neurological: He is alert. No cranial nerve deficit.  Skin: Skin is warm and dry. No rash noted. No erythema. No pallor.  Psychiatric: He has a normal mood and affect. His behavior is normal. Judgment and thought content normal.     Assessment/Plan 70 yr old male with multiple medical issues here with sepsis from an E coli bacteremia.  I have personally reviewed his past medical history which is fairly extensive with well-controlled COPD, recently in the hospital at [facility] with cardiology for fluid overload and he sees a rheumatologist and previous history of seeing a infectious disease doctor for his previous meningitis.  I personally reviewed his laboratory values which are significant for a slightly elevated lipase of 1610 bilirubin of 3.5 as well as an elevated creatinine of 3.4 and a lactic acid of 2.0 on arrival. These labs have improved since admission.  I've also personally reviewed his ultrasound images which show the cyst on the kidneys and not very good visualization of the pancreas as well as his CT scan  images which don't really show any  significant amount of inflammation around the pancreas however they do show a suspicion of a 2.5 cm lymph node near the portal triad and the duodenum as well as some calcifications in the pancreas. I also reviewed the radiology read as above.   While his elevation in lipase and bilirubin to suggest a component of pancreatitis he does not have an infected or necrotic pancreatitis and likely not the source of his E coli bacteremia but could be as a result of possible acute cholangitis and would see these lab values as well as an Escherichia coli bacteremia and he does have the 2.5 cm lymph node or mass or could even be choledochal cyst.  I would recommend getting an MRCP to further evaluate.  I do not think this patient needs any surgical intervention at this time.   Hubbard Robinson, MD 09/01/2016, 5:36 PM

## 2016-09-01 NOTE — Progress Notes (Addendum)
PULMONARY / CRITICAL CARE MEDICINE   Name: Gracelyn NurseJerry R Laino MRN: [id number] DOB: 18-Jun-1946    ADMISSION DATE:  08/30/2016 CONSULTATION DATE:  08/31/16  REFERRING MD:   Dr. Tobi BastosPyreddy  CHIEF COMPLAINT:  Hypotension/ Acute Pancreatitis HISTORY OF PRESENT ILLNESS:   Brent SkeansJerry Silva is a 70 year old male with known history of coronary artery disease, chronic kidney disease, congestive heart failure, COPD, diabetes mellitus, hypertension, hyperlipidemia, myocardial infarction s/p Cardiac Cath in November of this year. Patient presented to ED with confusion , abdominal pain and a fever of 1425f. Code sepsis was initiated by the hospitalist .  Patient received 2l of fluid bolus.  Patient continues to be hypotensive and therefore started on Levophed and PCCM team was consulted for further management.   PAST MEDICAL HISTORY :  He  has a past medical history of Arthritis; CAD (coronary artery disease); CHF (congestive heart failure) (HCC); Chronic kidney disease; COPD (chronic obstructive pulmonary disease) (HCC); Diabetes mellitus without complication (HCC); Hyperlipidemia; Hypertension; Myocardial infarction (2000); and PVD (peripheral vascular disease) (HCC).  PAST SURGICAL HISTORY: He  has a past surgical history that includes Coronary artery bypass graft (2005); Cardiac defibrillator placement (11/2014); Joint replacement (Right, 2005); and Fracture surgery (Left, 1998).  Allergies  Allergen Reactions  . Sulfa Antibiotics Other (See Comments)    hyperkalemia  . Other Other (See Comments)    Agents that cause hyperkalemia  . Umeclidinium Other (See Comments)    Chest congestion  . Clarithromycin Hives  . Ramipril Cough  . Tiotropium Other (See Comments)    Chest congestion    No current facility-administered medications on file prior to encounter.    Current Outpatient Prescriptions on File Prior to Encounter  Medication Sig  . albuterol (VENTOLIN HFA) 108 (90 BASE) MCG/ACT inhaler Inhale 2 puffs  into the lungs every 6 (six) hours as needed for wheezing.   Marland Kitchen. aspirin 81 MG tablet Take 81 mg by mouth daily.   Marland Kitchen. atorvastatin (LIPITOR) 40 MG tablet Take 40 mg by mouth at bedtime.   Marland Kitchen. doxazosin (CARDURA) 2 MG tablet Take 2 mg by mouth daily.   . Fluticasone-Salmeterol (ADVAIR DISKUS) 500-50 MCG/DOSE AEPB Inhale 1 puff into the lungs 2 (two) times daily.   Marland Kitchen. glipiZIDE (GLUCOTROL) 10 MG tablet Take 20 mg by mouth 2 (two) times daily before a meal.   . Insulin Glargine (LANTUS SOLOSTAR) 100 UNIT/ML Solostar Pen Inject 70 Units into the skin at bedtime.   Marland Kitchen. loratadine (CLARITIN) 10 MG tablet Take 10 mg by mouth daily as needed.   . metoprolol succinate (TOPROL-XL) 100 MG 24 hr tablet Take 100 mg by mouth daily.   . nitroGLYCERIN (NITROSTAT) 0.4 MG SL tablet Place 0.4 mg under the tongue every 5 (five) minutes as needed for chest pain.   Marland Kitchen. omeprazole (PRILOSEC) 20 MG capsule Take 20 mg by mouth 2 (two) times daily before a meal.   . Polyethylene Glycol 3350 POWD Take 1 Dose by mouth daily as needed.   . pregabalin (LYRICA) 75 MG capsule One po QD for one week, then one po Bid for one week, then one po tid  . ranolazine (RANEXA) 1000 MG SR tablet Take 1,000 mg by mouth 2 (two) times daily.   . tamsulosin (FLOMAX) 0.4 MG CAPS capsule Take 0.4 mg by mouth daily.   Marland Kitchen. losartan (COZAAR) 25 MG tablet Take 12.5 mg by mouth daily.   . nitroGLYCERIN (NITRODUR - DOSED IN MG/24 HR) 0.4 mg/hr patch Place onto the  skin.    FAMILY HISTORY:  His indicated that his father is deceased. He indicated that his brother is deceased.    SOCIAL HISTORY: He  reports that he quit smoking about 12 years ago. His smoking use included Cigarettes. He has a 50.00 pack-year smoking history. He has never used smokeless tobacco. He reports that he does not drink alcohol or use drugs.  REVIEW OF SYSTEMS:   Review of Systems  Constitutional: Positive for chills, fever and malaise/fatigue.  HENT: Negative for congestion, ear  discharge, ear pain, nosebleeds and sinus pain.   Eyes: Negative for photophobia, pain and discharge.  Respiratory: Negative for shortness of breath, wheezing and stridor.   Cardiovascular: Negative for palpitations, orthopnea and leg swelling.  Gastrointestinal: Positive for abdominal pain and nausea. Negative for blood in stool, constipation and diarrhea.  Genitourinary: Negative for frequency, hematuria and urgency.  Musculoskeletal: Negative for back pain and neck pain.  Skin: Negative for itching and rash.  Neurological: Negative for tremors, sensory change, speech change and weakness.  Endo/Heme/Allergies: Negative for environmental allergies and polydipsia.  Psychiatric/Behavioral: Negative for hallucinations, substance abuse and suicidal ideas. The patient is not nervous/anxious  SUBJECTIVE:  Patient states that he has been feeling better, Is off pressors at this time. Is afebrile. No acute issues overnight.  VITAL SIGNS: BP (!) 154/61   Pulse 77   Temp 98.3 F (36.8 C) (Oral)   Resp 16   Ht 5\' 11"  (1.803 m)   Wt (!) 140.1 kg (308 lb 13.8 oz)   SpO2 96%   BMI 43.08 kg/m   HEMODYNAMICS:    VENTILATOR SETTINGS:    INTAKE / OUTPUT: I/O last 3 completed shifts: In: 2186.9 [I.V.:936.9; IV Piggyback:1250] Out: 525 [Urine:525]  PHYSICAL EXAMINATION: General:  Morbidly obese, elderly genleman, in no acute distress Neuro:  Awake , alert, oriented, no focal deficits  HEENT: Atraumatic, normocephalic, no discharge , no JVD appreciated Cardiovascular:  S1S2, regular, no MRG noted Lungs:  Diminished, no wheezes, crackles, rhonchi noted Abdomen: Firm, active bowel sounds Musculoskeletal:  No inflammation/deformity noted Skin: Grossly intact  LABS:  BMET  Recent Labs Lab 08/31/16 0701 08/31/16 1230 08/31/16 2011  NA 136 135 135  K 5.3* 5.1 4.1  CL 106 105 105  CO2 23 24 24   BUN 46* 52* 49*  CREATININE 3.50* 3.99* 3.95*  GLUCOSE 108* 131* 255*     Electrolytes  Recent Labs Lab 08/31/16 0701 08/31/16 1230 08/31/16 2011  CALCIUM 8.1* 7.7* 7.8*    CBC  Recent Labs Lab 08/31/16 0016 08/31/16 0701  WBC 6.2 9.4  HGB 13.0 12.8*  HCT 38.0* 37.5*  PLT 93* 92*    Coag's  Recent Labs Lab 08/31/16 0701  INR 1.11    Sepsis Markers  Recent Labs Lab 08/31/16 0016 08/31/16 0405 08/31/16 0701  LATICACIDVEN 1.9 2.0* 1.2    ABG No results for input(s): PHART, PCO2ART, PO2ART in the last 168 hours.  Liver Enzymes  Recent Labs Lab 08/31/16 0016 08/31/16 0701 08/31/16 2011  AST 133* 109* 65*  ALT 87* 87* 69*  ALKPHOS 232* 194* 162*  BILITOT 3.5* 3.8* 2.3*  ALBUMIN 4.1 3.6 3.3*    Cardiac Enzymes No results for input(s): TROPONINI, PROBNP in the last 168 hours.  Glucose  Recent Labs Lab 08/31/16 1818 08/31/16 1940 08/31/16 2033 08/31/16 2215 08/31/16 2356 09/01/16 0156  GLUCAP 79 60* 137* 84 76 91    Imaging Ct Abdomen Pelvis Wo Contrast  Result Date: 08/31/2016 CLINICAL DATA:  Epigastric pain, fever, elevated lipase, status post cholecystectomy. EXAM: CT ABDOMEN AND PELVIS WITHOUT CONTRAST TECHNIQUE: Multidetector CT imaging of the abdomen and pelvis was performed following the standard protocol without IV contrast. COMPARISON:  02/17/2008 FINDINGS: Lower chest: Lung bases shows bilateral posterior or atelectasis. Trace posterior pleural effusion. The study is limited without IV contrast. Hepatobiliary: Unenhanced liver shows no biliary ductal dilatation. The patient is status post cholecystectomy. Pancreas: There is subtle mild stranding of peripancreatic fat especially in the region of pancreatic head and pancreatic body. Mild pancreatitis cannot be excluded. Clinical correlation is necessary. Spleen: Unenhanced spleen measures 16.7 cm in length. Adrenals/Urinary Tract: No adrenal gland again noted bilateral thickened adrenal glands. No hydronephrosis or hydroureter. There is nonobstructive  cortical calcification in midpole of the right kidney anteriorly measures 3.5 mm. Exophytic lesion upper pole medial aspect of the right kidney measures 1.3 cm. This may represent mild complex cyst. Correlation with ultrasound or MRI is recommended. Exophytic cyst in midpole of the left kidney measures 1.3 cm. There is a cortical cyst in upper pole of the left kidney measures 1.5 cm. No calcified ureteral calculi are noted. Stomach/Bowel: There is no small bowel obstruction. Moderate stool noted throughout the colon. Vascular/Lymphatic: Atherosclerotic calcifications are noted splenic artery and hepatic artery. SMA calcifications are noted. Mild atherosclerotic calcification distal abdominal aorta and iliac arteries. No aortic aneurysm. There is a pathologic portacaval lymph node measures 2.4 cm. This might be reactive. Clinical correlation is necessary. Please see axial image 38. On the prior exam this measures 1.7 cm. Reproductive: Prostate gland seminal vesicles are unremarkable. No pelvic mass. The urinary bladder is unremarkable. Other: No ascites or free abdominal air. Small nonspecific bilateral inguinal lymph nodes are noted. Musculoskeletal: No destructive bony lesions are noted. There are degenerative changes thoracolumbar spine. IMPRESSION: 1. Limited study without IV contrast. There is mild stranding of peripancreatic fat especially in the region of pancreatic head and body of the pancreas. Mild pancreatitis cannot be excluded. 2. Bilateral lung bases posterior of atelectasis. Trace pleural effusion. 3. Status post cholecystectomy. 4. Right nonobstructive nephrolithiasis. Probable mild complex cyst in upper pole medial aspect of the right kidney measures 1.3 cm. Further correlation with ultrasound and/or MRI is recommended. Left renal cysts are noted. No hydronephrosis or hydroureter. 5. There is enlarged lymph node in portal caval region measures 2.4 cm with mild progression from prior exam this may be  reactive. Clinical correlation is necessary. 6. Moderate colonic stool. 7. Again noted thickening of adrenal glands bilaterally. 8. Degenerative changes thoracolumbar spine. 9. No small bowel obstruction. Electronically Signed   By: Natasha Mead M.D.   On: 08/31/2016 09:24   US Abdomen Complete  Result Date: 08/31/2016 CLINICAL DATA:  Abdominal pain EXAM: ABDOMEN ULTRASOUND COMPLETE COMPARISON:  CT abdomen and pelvis August 31, 2016 FINDINGS: Gallbladder: Surgically absent. Common bile duct: Diameter: 4 mm. There is no intrahepatic, common hepatic, or common bile duct dilatation. Liver: No focal lesion identified. Within normal limits in parenchymal echogenicity. IVC: No abnormality visualized. Pancreas: Visualized portion unremarkable. Much of the pancreas is obscured by gas. Spleen: Spleen measures 9.6 x 15.3 x 5.6 cm with a measures splenic volume of 428 cubic cm. No focal splenic lesions are evident. Right Kidney: Length: 11.2 cm. Echogenicity within normal limits. There is renal cortical thinning. No mass or hydronephrosis visualized. Left Kidney: Length: 9.8 cm. Echogenicity within normal limits. There is renal cortical thinning. No hydronephrosis visualized. There is a hypoechoic but apparently noncystic mass arising from  the upper pole of the left kidney measuring 2.0 x 2.1 x 1.8 cm. Abdominal aorta: No aneurysm visualized. Other findings: No demonstrable ascites. IMPRESSION: Hypoechoic but noncystic appearing mass arising from left kidney upper pole region. This mass measures 2.0 x 2.1 x 1.8 cm. Further evaluation with pre and post contrast MRI or CT should be considered. MRI is preferred in younger patients (due to lack of ionizing radiation) and for evaluating calcified lesion(s). Absent gallbladder. Prominent spleen without focal splenic lesion. Much of pancreas obscured by gas. Visualized portions of pancreas appear unremarkable. Each kidney shows renal cortical thinning, a finding that may be  indicative of a degree of medical renal disease. The renal echogenicity is within normal limits bilaterally. No obstructing foci identified in either kidney. Electronically Signed   By: Bretta BangWilliam  Woodruff III M.D.   On: 08/31/2016 09:39   Dg Chest Port 1 View  Result Date: 08/31/2016 CLINICAL DATA:  Central line adjustment EXAM: PORTABLE CHEST 1 VIEW COMPARISON:  08/31/2016 FINDINGS: Right central line tip is in the SVC. No pneumothorax. 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 Prior CABG. No confluent opacities or overt edema. No effusions or acute bony abnormality. IMPRESSION: Right central line tip in the SVC.  No pneumothorax. Cardiomegaly, vascular congestion. Electronically Signed   By: Charlett NoseKevin  Dover M.D.   On: 08/31/2016 13:09   Dg Chest Port 1 View  Result Date: 08/31/2016 CLINICAL DATA:  Central line placement. EXAM: PORTABLE CHEST 1 VIEW 12:29 p.m. COMPARISON:  08/31/2016 at 2:18 a.m. FINDINGS: There is a new right jugular vein catheter with the tip in the superior vena cava. No pneumothorax. Heart size and pulmonary vascularity are normal. Lungs are clear. AICD in place. CABG. IMPRESSION: Central line appears in good position. No pneumothorax. No active disease. Electronically Signed   By: Francene BoyersJames  Maxwell M.D.   On: 08/31/2016 12:45     STUDIES:  12/13 CT head>>No evidence of large acute infarct, intracranial hemorrhage, orfocal mass effect  12/13Ct abdomen>>mild stranding of peripancreatic fat especially in the region of pancreatic head and body of the pancreas, pathologic portacaval lymph, node measures 2.4 cm, surgically absent gallbladder  12/13Abd  ultrasound>>2.0x2.1x1.8cm left kidney mas, pancreas obscured by gas  CULTURES: 12/13 blood culture>>e.coli 12/13 Urine culture>>  ANTIBIOTICS: 12/13>> Zosyn 12/13>>meropenem   SIGNIFICANT EVENTS: 12/13 Patient admitted to the ICU with sepsis secondary to pancreatitis  And hypotension  LINES/TUBES:  12/13 RIJ CVL   ASSESSMENT  / PLAN:  ASSESSMENT / PLAN:  PULMONARY A: Hx of COPD P:   Continue to support with O2  To keep sats >92% Bronchodilators  CARDIOVASCULAR A:  CAD CHF Hypotension secondary to possible sepsis- resolved Hyperlipidemia Hx of MI -Cardiac Cath MI CABG-2005 P Levophed stopped -12/14,  Keep MAP goals >65 Continuous telemetry  RENAL A:   Chronic kidney disease Hyperkalemia-resolved Hypoalbuminemia P:   Follow cbc Replace electrolytes per ICU protocol Avoid nephrotoxic drugs , I/v dyes, contrast medium.  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 Reorientation Minimize sedating drugs   Bincy Varughese,AG-ACNP Pulmonary and Critical Care Medicine [facility]   09/01/2016, 3:02 AM STAFF NOTE: I, Dr. Stephanie Acre have personally reviewed patient's available data, including medical history, events of note, physical examination and test results as part of my evaluation. I have discussed with NP Karin Golden  and other care providers such as pharmacist, RN and RRT.   S: doing better this AM, some pain around foley insertion site. Asking for water. Lipase normalized today.   A: Pancreatitis Septic Shock Hx of  copd CAD CHF HLD Elevated lipase-now normalizing Elevated ammonia Left kidney mass (follows at [facility]) Portal Caval node Acute on Chronic Renal Failure Bacteremia - E. coli   P: -significant clinical improvement - off pressors this morning.  - cont with IVFs, d\c bicarb this AM, cont with D10 until increase PO intake.  - 12/13> d/w case with surgery, Dr. Orvis Brill, recommended MRCP to further evaluate portal caval node, and make sure it is not a mass. -Ecoli bacteremia, now on meropenem and vanc.  -overall good clinical improvement.   .  Rest per NP/medical resident whose note is outlined above and that I agree with  The patient is critically ill with multiple organ systems failure and requires high complexity decision making for assessment and support, frequent evaluation and titration of therapies, application of advanced monitoring technologies and extensive interpretation of multiple databases.   Critical Care Time devoted to patient care services described in this note is  35 Minutes.   This time reflects time of care of this signee Dr Stephanie Acre.  This critical care time does not reflect procedure time, or teaching time or supervisory time of PA/NP/Med-student/Med Resident etc but could involve care discussion time.  Stephanie Acre, MD [place] [facility] Pager [phone] (please enter 7-digits) On Call Pager - [phone] (please enter 7-digits)  Note: This note was prepared with Dragon dictation along with smaller phrase technology. Any transcriptional errors that result from this process are unintentional.

## 2016-09-01 NOTE — Progress Notes (Addendum)
Pharmacy Antibiotic Note  [name] is a 70 y.o. male admitted on 08/30/2016 with  bacteremia.  Pharmacy has been consulted for Meropenem dosing.  Plan: Will continue patient of meropenem 500mg  IV Q12hr.     Height: 5\' 11"  (180.3 cm) Weight: (!) 308 lb 13.8 oz (140.1 kg) IBW/kg (Calculated) : 75.3  Temp (24hrs), Avg:98.1 F (36.7 C), Min:97.3 F (36.3 C), Max:98.7 F (37.1 C)   Recent Labs Lab 08/31/16 0016 08/31/16 0405 08/31/16 0701 08/31/16 1230 08/31/16 2011 09/01/16 0457  WBC 6.2  --  9.4  --   --  4.1  CREATININE 3.04*  --  3.50* 3.99* 3.95* 3.47*  LATICACIDVEN 1.9 2.0* 1.2  --   --   --     Estimated Creatinine Clearance: 27.5 mL/min (by C-G formula based on SCr of 3.47 mg/dL (H)).    Allergies  Allergen Reactions  . Sulfa Antibiotics Other (See Comments)    hyperkalemia  . Other Other (See Comments)    Agents that cause hyperkalemia  . Umeclidinium Other (See Comments)    Chest congestion  . Clarithromycin Hives  . Ramipril Cough  . Tiotropium Other (See Comments)    Chest congestion    Antimicrobials this admission: 12/13 Zosyn  >> 12/13 12/13 Meropenem >>   Dose adjustments this admission:   Microbiology results: 12/13 BCx: Ecoli 12/13 BCID: Ecoli 12/13 MRSA PCR: negative   Pharmacy will continue to monitor and adjust per consult.   MLS 09/01/2016 3:25 PM

## 2016-09-01 NOTE — Progress Notes (Signed)
Much improved today. Less confused and able to tolerate clear liquids. Moving around in bed. Bilateral arm tremors continue but patient states they are not as severe today. Restarted on his Lyrica. 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

## 2016-09-01 NOTE — Progress Notes (Signed)
[facility] [place] [facility]   PATIENT NAME: Brent SkeansJerry Schrier    MR#:  [id number]  DATE OF BIRTH:  Jan 28, 1946  SUBJECTIVE:  CHIEF COMPLAINT:   Chief Complaint  Patient presents with  . Altered Mental Status   The patient is 70 year old Caucasian male with past medical history significant for history of coronary artery disease, CAD, congestive heart failure, COPD, diabetes, hypertension, hyperlipidemia, peripheral vascular disease, pancreatitis, who presents to the hospital with confusion, epigastric abdominal pain, high fever. In the emergency room he was noted to be jaundiced with total bilirubin of 3.5, elevated lipase level of 1700. CT of abdomen and pelvis revealed pancreatitis, nonobstructive nephrolithiasis, no hydronephrosis, ultrasound of abdomen showed possible left renal mass. Blood cultures are growing Escherichia coli, patient is off vancomycin and on meropenem at present, urine output has improved. Creatinine has improved as well. He is off Levophed. Blood pressure stable. The patient feels good. Denies any abdominal pain. Lipase level has improved to 92 from 1700. Total bilirubin has improved.     .  Review of Systems  Unable to perform ROS: Critical illness    VITAL SIGNS: Blood pressure (!) 113/49, pulse 70, temperature 98.3 F (36.8 C), temperature source Oral, resp. rate 15, height 5\' 11"  (1.803 m), weight (!) 140.1 kg (308 lb 13.8 oz), SpO2 95 %.  PHYSICAL EXAMINATION:   GENERAL:  70 y.o.-year-old patient lying in the bed in no acute distress, talkative, comfortable. Intermittent myoclonus EYES: Pupils equal, round, reactive to light and accommodation. No scleral icterus. Extraocular muscles intact.  HEENT: Head atraumatic, normocephalic. Oropharynx and nasopharynx clear.  NECK:  Supple, no jugular venous distention. No thyroid enlargement, no tenderness.  LUNGS: good air entrance bilaterally,  no wheezing, rales,rhonchi or  crepitation. CARDIOVASCULAR: S1, S2 normal. No murmurs, rubs, or gallops.  ABDOMEN: Soft, tight upper abdomen, no significant pain on palpation, not distended. Bowel sounds are normal. No organomegaly or mass.  EXTREMITIES: trace lower extremity and pedal edema, no cyanosis, or clubbing.  NEUROLOGIC: Cranial nerves II through XII are intact. Muscle strength 5/5 in all extremities. Sensation intact. Gait not checked.  PSYCHIATRIC: The patient is alert and oriented x 3.  SKIN: No obvious rash, lesion, or ulcer.   ORDERS/RESULTS REVIEWED:   CBC  Recent Labs Lab 08/31/16 0016 08/31/16 0701 09/01/16 0457  WBC 6.2 9.4 4.1  HGB 13.0 12.8* 11.7*  HCT 38.0* 37.5* 34.3*  PLT 93* 92* 74*  MCV 94.3 94.9 94.6  MCH 32.3 32.5 32.2  MCHC 34.2 34.2 34.1  RDW 14.0 14.2 14.4  LYMPHSABS 0.1*  --   --   MONOABS 0.2  --   --   EOSABS 0.1  --   --   BASOSABS 0.0  --   --    ------------------------------------------------------------------------------------------------------------------  Chemistries   Recent Labs Lab 08/31/16 0016 08/31/16 0701 08/31/16 1230 08/31/16 2011 09/01/16 0457  NA 137 136 135 135 138  K 5.2* 5.3* 5.1 4.1 4.5  CL 105 106 105 105 104  CO2 26 23 24 24 29   GLUCOSE 153* 108* 131* 255* 228*  BUN 45* 46* 52* 49* 46*  CREATININE 3.04* 3.50* 3.99* 3.95* 3.47*  CALCIUM 8.9 8.1* 7.7* 7.8* 7.8*  AST 133* 109*  --  65* 53*  ALT 87* 87*  --  69* 62  ALKPHOS 232* 194*  --  162* 155*  BILITOT 3.5* 3.8*  --  2.3* 1.5*   ------------------------------------------------------------------------------------------------------------------ estimated creatinine clearance is 27.5 mL/min (by C-G  formula based on SCr of 3.47 mg/dL (H)). ------------------------------------------------------------------------------------------------------------------ No results for input(s): TSH, T4TOTAL, T3FREE, THYROIDAB in the last 72 hours.  Invalid input(s): FREET3  Cardiac Enzymes No results  for input(s): CKMB, TROPONINI, MYOGLOBIN in the last 168 hours.  Invalid input(s): CK ------------------------------------------------------------------------------------------------------------------ Invalid input(s): POCBNP ---------------------------------------------------------------------------------------------------------------  RADIOLOGY: Ct Abdomen Pelvis Wo Contrast  Result Date: 08/31/2016 CLINICAL DATA:  Epigastric pain, fever, elevated lipase, status post cholecystectomy. EXAM: CT ABDOMEN AND PELVIS WITHOUT CONTRAST TECHNIQUE: Multidetector CT imaging of the abdomen and pelvis was performed following the standard protocol without IV contrast. COMPARISON:  02/17/2008 FINDINGS: Lower chest: Lung bases shows bilateral posterior or atelectasis. Trace posterior pleural effusion. The study is limited without IV contrast. Hepatobiliary: Unenhanced liver shows no biliary ductal dilatation. The patient is status post cholecystectomy. Pancreas: There is subtle mild stranding of peripancreatic fat especially in the region of pancreatic head and pancreatic body. Mild pancreatitis cannot be excluded. Clinical correlation is necessary. Spleen: Unenhanced spleen measures 16.7 cm in length. Adrenals/Urinary Tract: No adrenal gland again noted bilateral thickened adrenal glands. No hydronephrosis or hydroureter. There is nonobstructive cortical calcification in midpole of the right kidney anteriorly measures 3.5 mm. Exophytic lesion upper pole medial aspect of the right kidney measures 1.3 cm. This may represent mild complex cyst. Correlation with ultrasound or MRI is recommended. Exophytic cyst in midpole of the left kidney measures 1.3 cm. There is a cortical cyst in upper pole of the left kidney measures 1.5 cm. No calcified ureteral calculi are noted. Stomach/Bowel: There is no small bowel obstruction. Moderate stool noted throughout the colon. Vascular/Lymphatic: Atherosclerotic calcifications are noted  splenic artery and hepatic artery. SMA calcifications are noted. Mild atherosclerotic calcification distal abdominal aorta and iliac arteries. No aortic aneurysm. There is a pathologic portacaval lymph node measures 2.4 cm. This might be reactive. Clinical correlation is necessary. Please see axial image 38. On the prior exam this measures 1.7 cm. Reproductive: Prostate gland seminal vesicles are unremarkable. No pelvic mass. The urinary bladder is unremarkable. Other: No ascites or free abdominal air. Small nonspecific bilateral inguinal lymph nodes are noted. Musculoskeletal: No destructive bony lesions are noted. There are degenerative changes thoracolumbar spine. IMPRESSION: 1. Limited study without IV contrast. There is mild stranding of peripancreatic fat especially in the region of pancreatic head and body of the pancreas. Mild pancreatitis cannot be excluded. 2. Bilateral lung bases posterior of atelectasis. Trace pleural effusion. 3. Status post cholecystectomy. 4. Right nonobstructive nephrolithiasis. Probable mild complex cyst in upper pole medial aspect of the right kidney measures 1.3 cm. Further correlation with ultrasound and/or MRI is recommended. Left renal cysts are noted. No hydronephrosis or hydroureter. 5. There is enlarged lymph node in portal caval region measures 2.4 cm with mild progression from prior exam this may be reactive. Clinical correlation is necessary. 6. Moderate colonic stool. 7. Again noted thickening of adrenal glands bilaterally. 8. Degenerative changes thoracolumbar spine. 9. No small bowel obstruction. Electronically Signed   By: Natasha Mead M.D.   On: 08/31/2016 09:24   Ct Head Wo Contrast  Result Date: 08/31/2016 CLINICAL DATA:  70 y/o  M; altered mental status. EXAM: CT HEAD WITHOUT CONTRAST TECHNIQUE: Contiguous axial images were obtained from the base of the skull through the vertex without intravenous contrast. COMPARISON:  12/29/2005 CT head FINDINGS: Brain: No  evidence of acute infarction, hemorrhage, hydrocephalus, extra-axial collection or mass lesion/mass effect. Mild chronic microvascular ischemic changes and parenchymal volume loss of the brain are progressed from the prior  CT of the head. Lucency within the left hemi pons is compatible with an age-indeterminate lacunar infarct (series 2, image 11). Vascular: Calcific atherosclerosis of the cavernous internal carotid arteries and the vertebrobasilar system. Skull: Normal. Negative for fracture or focal lesion. Sinuses/Orbits: No acute finding. Other: None. IMPRESSION: 1. No evidence of large acute infarct, intracranial hemorrhage, or focal mass effect. 2. Mild chronic microvascular ischemic changes and parenchymal volume loss of the brain are progressed from 2007. 3. Left hemi pons lucency compatible with age-indeterminate lacunar infarct. Electronically Signed   By: Mitzi Hansen M.D.   On: 08/31/2016 01:26   US Abdomen Complete  Result Date: 08/31/2016 CLINICAL DATA:  Abdominal pain EXAM: ABDOMEN ULTRASOUND COMPLETE COMPARISON:  CT abdomen and pelvis August 31, 2016 FINDINGS: Gallbladder: Surgically absent. Common bile duct: Diameter: 4 mm. There is no intrahepatic, common hepatic, or common bile duct dilatation. Liver: No focal lesion identified. Within normal limits in parenchymal echogenicity. IVC: No abnormality visualized. Pancreas: Visualized portion unremarkable. Much of the pancreas is obscured by gas. Spleen: Spleen measures 9.6 x 15.3 x 5.6 cm with a measures splenic volume of 428 cubic cm. No focal splenic lesions are evident. Right Kidney: Length: 11.2 cm. Echogenicity within normal limits. There is renal cortical thinning. No mass or hydronephrosis visualized. Left Kidney: Length: 9.8 cm. Echogenicity within normal limits. There is renal cortical thinning. No hydronephrosis visualized. There is a hypoechoic but apparently noncystic mass arising from the upper pole of the left kidney  measuring 2.0 x 2.1 x 1.8 cm. Abdominal aorta: No aneurysm visualized. Other findings: No demonstrable ascites. IMPRESSION: Hypoechoic but noncystic appearing mass arising from left kidney upper pole region. This mass measures 2.0 x 2.1 x 1.8 cm. Further evaluation with pre and post contrast MRI or CT should be considered. MRI is preferred in younger patients (due to lack of ionizing radiation) and for evaluating calcified lesion(s). Absent gallbladder. Prominent spleen without focal splenic lesion. Much of pancreas obscured by gas. Visualized portions of pancreas appear unremarkable. Each kidney shows renal cortical thinning, a finding that may be indicative of a degree of medical renal disease. The renal echogenicity is within normal limits bilaterally. No obstructing foci identified in either kidney. Electronically Signed   By: Bretta Bang III M.D.   On: 08/31/2016 09:39   Dg Chest Port 1 View  Result Date: 08/31/2016 CLINICAL DATA:  Central line adjustment EXAM: PORTABLE CHEST 1 VIEW COMPARISON:  08/31/2016 FINDINGS: Right central line tip is in the SVC. No pneumothorax. 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 Prior CABG. No confluent opacities or overt edema. No effusions or acute bony abnormality. IMPRESSION: Right central line tip in the SVC.  No pneumothorax. Cardiomegaly, vascular congestion. Electronically Signed   By: Charlett Nose M.D.   On: 08/31/2016 13:09   Dg Chest Port 1 View  Result Date: 08/31/2016 CLINICAL DATA:  Central line placement. EXAM: PORTABLE CHEST 1 VIEW 12:29 p.m. COMPARISON:  08/31/2016 at 2:18 a.m. FINDINGS: There is a new right jugular vein catheter with the tip in the superior vena cava. No pneumothorax. Heart size and pulmonary vascularity are normal. Lungs are clear. AICD in place. CABG. IMPRESSION: Central line appears in good position. No pneumothorax. No active disease. Electronically Signed   By: Francene Boyers M.D.   On: 08/31/2016 12:45   Dg Chest Portable 1  View  Result Date: 08/31/2016 CLINICAL DATA:  70 y/o  M; dyspnea and fever. EXAM: PORTABLE CHEST 1 VIEW COMPARISON:  11/13/2009 chest radiograph FINDINGS:  Stable enlarged cardiac silhouette. Two lead AICD. Stable obscuration of left heart border possibly representing postsurgical changes and/or a prominent epicardial fat pad. Moderate bronchitic markings. No focal consolidation. Post median sternotomy with multiple broken wires similar to prior study. IMPRESSION: Moderate bronchitic markings may represent bronchitis. No new focal consolidation. Electronically Signed   By: Mitzi HansenLance  Furusawa-Stratton M.D.   On: 08/31/2016 02:49    EKG:  Orders placed or performed during the hospital encounter of 08/30/16  . EKG 12-Lead  . EKG 12-Lead  . ED EKG 12-Lead  . ED EKG 12-Lead    ASSESSMENT AND PLAN:  Principal Problem:   Sepsis (HCC) Active Problems:   Acute pancreatitis  #1. Escherichia coli sepsis, continue meropenem,  urine analysis was unremarkable, suspected cholangitis ,  appreciate gastroenterology consultation,  continue IV fluids, improved clinically #2 Septic shock, resolved, now off Levophed, continue low rate IV fluids, following closely #3. Acute on chronic renal failure, appreciate nephrologist input, no hydronephrosis was noted on CT,left  renal mass has been investigated in 2016 by [facility], creatinine has improved, as well as urinary output, follow creatinine in the morning #4. Hypoglycemia, resolved on D10 dextrose solution, due to  Sepsis #5. Hyperkalemia, resolved with bicarbonate drip, improved kidney function.  #6. Acute cholangitis, etiology is unclear, questionable passed stone, bilirubin is improving, as well as LFTs,, continue meropenem, IV fluids,initiate clear liquid diet. Common bile duct on ultrasound was 4 mm, appreciate gastroenterologist and with it, awaiting for surgery recommendations, unlikely pancreatic phlegmon due to fast improvement of the patient's clinical  status #7. Acute pancreatitis, questionable biliary duct stones/obstruction, lipase level has improved, follow labs closely, initiate patient on clear liquid diet #8. Lactic acidosis, improved with conservative therapy #9. 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   DRUG ALLERGIES:  Allergies  Allergen Reactions  . Sulfa Antibiotics Other (See Comments)    hyperkalemia  . Other Other (See Comments)    Agents that cause hyperkalemia  . Umeclidinium Other (See Comments)    Chest congestion  . Clarithromycin Hives  . Ramipril Cough  . Tiotropium Other (See Comments)    Chest congestion    CODE STATUS:     Code Status Orders        Start     Ordered   08/31/16 0544  Full code  Continuous     08/31/16 0543    Code Status History    Date Active Date Inactive Code Status Order ID Comments User Context   This patient has a current code status but no historical code status.      TOTAL TIME TAKING CARE OF THIS PATIENT: 40 minutes.    Katharina CaperVAICKUTE,Jaeda Bruso M.D on 09/01/2016 at 4:21 PM  Between 7am to 6pm - Pager - [phone]  After 6pm go to [email or web address] - password [email or web address] [email or web address]  Office  [phone]  CC: Primary care physician; CROWDER, Pete PeltJONATHAN EARL, MD

## 2016-09-02 DIAGNOSIS — R6521 Severe sepsis with septic shock: Secondary | ICD-10-CM

## 2016-09-02 LAB — BASIC METABOLIC PANEL
Anion gap: 5 (ref 5–15)
BUN: 39 mg/dL — AB (ref 6–20)
CHLORIDE: 107 mmol/L (ref 101–111)
CO2: 25 mmol/L (ref 22–32)
Calcium: 8.5 mg/dL — ABNORMAL LOW (ref 8.9–10.3)
Creatinine, Ser: 2.71 mg/dL — ABNORMAL HIGH (ref 0.61–1.24)
GFR calc Af Amer: 25 mL/min — ABNORMAL LOW (ref >60)
GFR calc non Af Amer: 22 mL/min — ABNORMAL LOW (ref >60)
GLUCOSE: 157 mg/dL — AB (ref 65–99)
POTASSIUM: 4.4 mmol/L (ref 3.5–5.1)
Sodium: 137 mmol/L (ref 135–145)

## 2016-09-02 LAB — CULTURE, BLOOD (ROUTINE X 2)

## 2016-09-02 LAB — PLATELET COUNT: Platelets: 73 10*3/uL — ABNORMAL LOW (ref 150–440)

## 2016-09-02 LAB — LIPASE, BLOOD: Lipase: 150 U/L — ABNORMAL HIGH (ref 11–51)

## 2016-09-02 LAB — GLUCOSE, CAPILLARY
GLUCOSE-CAPILLARY: 140 mg/dL — AB (ref 65–99)
GLUCOSE-CAPILLARY: 125 mg/dL — AB (ref 65–99)
GLUCOSE-CAPILLARY: 134 mg/dL — AB (ref 65–99)
Glucose-Capillary: 144 mg/dL — ABNORMAL HIGH (ref 65–99)
Glucose-Capillary: 158 mg/dL — ABNORMAL HIGH (ref 65–99)

## 2016-09-02 MED ORDER — URSODIOL 300 MG PO CAPS
300.0000 mg | ORAL_CAPSULE | Freq: Two times a day (BID) | ORAL | Status: DC
  Administered 2016-09-02 – 2016-09-04 (×4): 300 mg via ORAL
  Filled 2016-09-02 (×4): qty 1

## 2016-09-02 MED ORDER — CEFAZOLIN SODIUM-DEXTROSE 2-4 GM/100ML-% IV SOLN
2.0000 g | Freq: Three times a day (TID) | INTRAVENOUS | Status: DC
  Administered 2016-09-02 – 2016-09-04 (×6): 2 g via INTRAVENOUS
  Filled 2016-09-02 (×9): qty 100

## 2016-09-02 MED ORDER — LORAZEPAM 2 MG/ML IJ SOLN: 1.0000 mg | Freq: Once | INTRAMUSCULAR | Status: DC

## 2016-09-02 MED ORDER — IBUPROFEN 100 MG/5ML PO SUSP
200.0000 mg | Freq: Four times a day (QID) | ORAL | Status: DC | PRN
  Filled 2016-09-02: qty 10

## 2016-09-02 MED ORDER — HYDRALAZINE HCL 25 MG PO TABS
25.0000 mg | ORAL_TABLET | Freq: Three times a day (TID) | ORAL | Status: DC
  Administered 2016-09-02 – 2016-09-04 (×5): 25 mg via ORAL
  Filled 2016-09-02 (×5): qty 1

## 2016-09-02 NOTE — Progress Notes (Signed)
FBS 136

## 2016-09-02 NOTE — Progress Notes (Signed)
[facility], spouse at bedside notified of pt's transfer to room 138, attempted to call Dr Flonnie HailstoneVaickutte regarding pt not being able to get an MRI d/t AICD in place, awaiting MD's callback. VS wnl at time of transfer.

## 2016-09-02 NOTE — Consult Note (Signed)
I will sign off.  Dr. Earlean PolkaSolik is moonlighting if you have need for repeat GI consult.

## 2016-09-02 NOTE — Progress Notes (Signed)
[facility] [place] [facility]   PATIENT NAME: Brent Silva    MR#:  [id number]  DATE OF BIRTH:  1946-04-30  SUBJECTIVE:  CHIEF COMPLAINT:   Chief Complaint  Patient presents with  . Altered Mental Status   The patient is 70 year old Caucasian male with past medical history significant for history of coronary artery disease, CAD, congestive heart failure, COPD, diabetes, hypertension, hyperlipidemia, peripheral vascular disease, pancreatitis, who presents to the hospital with confusion, epigastric abdominal pain, high fever. In the emergency room he was noted to be jaundiced with total bilirubin of 3.5, elevated lipase level of 1700. CT of abdomen and pelvis revealed pancreatitis, nonobstructive nephrolithiasis, no hydronephrosis, ultrasound of abdomen showed possible left renal mass. Blood cultures are growing Escherichia coli, patient is off vancomycin and on meropenem at present, urine output has improved. Creatinine has improved as well. He is off Levophed. Blood pressure stable. The patient feels good. Denies any abdominal pain. Lipase level has normalized. MRCP could not be done due to cardiac defibrillator. Ursodiol  was recommended by gastroenterologist.    .  Review of Systems  Unable to perform ROS: Critical illness    VITAL SIGNS: Blood pressure (!) 151/69, pulse 63, temperature 97.9 F (36.6 C), temperature source Oral, resp. rate 20, height 5\' 11"  (1.803 m), weight (!) 140.1 kg (308 lb 13.8 oz), SpO2 94 %.  PHYSICAL EXAMINATION:   GENERAL:  70 y.o.-year-old patient lying in the bed in no acute distress, talkative, comfortable. Intermittent myoclonus EYES: Pupils equal, round, reactive to light and accommodation. No scleral icterus. Extraocular muscles intact.  HEENT: Head atraumatic, normocephalic. Oropharynx and nasopharynx clear.  NECK:  Supple, no jugular venous distention. No thyroid enlargement, no tenderness.  LUNGS: good air entrance  bilaterally,  no wheezing, rales,rhonchi or crepitation. CARDIOVASCULAR: S1, S2 normal. No murmurs, rubs, or gallops.  ABDOMEN: Soft, tight upper abdomen, no significant pain on palpation, not distended. Bowel sounds are normal. No organomegaly or mass.  EXTREMITIES: trace lower extremity and pedal edema, no cyanosis, or clubbing.  NEUROLOGIC: Cranial nerves II through XII are intact. Muscle strength 5/5 in all extremities. Sensation intact. Gait not checked.  PSYCHIATRIC: The patient is alert and oriented x 3.  SKIN: No obvious rash, lesion, or ulcer.   ORDERS/RESULTS REVIEWED:   CBC  Recent Labs Lab 08/31/16 0016 08/31/16 0701 09/01/16 0457 09/02/16 0557  WBC 6.2 9.4 4.1  --   HGB 13.0 12.8* 11.7*  --   HCT 38.0* 37.5* 34.3*  --   PLT 93* 92* 74* 73*  MCV 94.3 94.9 94.6  --   MCH 32.3 32.5 32.2  --   MCHC 34.2 34.2 34.1  --   RDW 14.0 14.2 14.4  --   LYMPHSABS 0.1*  --   --   --   MONOABS 0.2  --   --   --   EOSABS 0.1  --   --   --   BASOSABS 0.0  --   --   --    ------------------------------------------------------------------------------------------------------------------  Chemistries   Recent Labs Lab 08/31/16 0016 08/31/16 0701 08/31/16 1230 08/31/16 2011 09/01/16 0457 09/02/16 0557  NA 137 136 135 135 138 137  K 5.2* 5.3* 5.1 4.1 4.5 4.4  CL 105 106 105 105 104 107  CO2 26 23 24 24 29 25   GLUCOSE 153* 108* 131* 255* 228* 157*  BUN 45* 46* 52* 49* 46* 39*  CREATININE 3.04* 3.50* 3.99* 3.95* 3.47* 2.71*  CALCIUM 8.9  8.1* 7.7* 7.8* 7.8* 8.5*  AST 133* 109*  --  65* 53*  --   ALT 87* 87*  --  69* 62  --   ALKPHOS 232* 194*  --  162* 155*  --   BILITOT 3.5* 3.8*  --  2.3* 1.5*  --    ------------------------------------------------------------------------------------------------------------------ estimated creatinine clearance is 35.3 mL/min (by C-G formula based on SCr of 2.71 mg/dL  (H)). ------------------------------------------------------------------------------------------------------------------ No results for input(s): TSH, T4TOTAL, T3FREE, THYROIDAB in the last 72 hours.  Invalid input(s): FREET3  Cardiac Enzymes No results for input(s): CKMB, TROPONINI, MYOGLOBIN in the last 168 hours.  Invalid input(s): CK ------------------------------------------------------------------------------------------------------------------ Invalid input(s): POCBNP ---------------------------------------------------------------------------------------------------------------  RADIOLOGY: No results found.  EKG:  Orders placed or performed during the hospital encounter of 08/30/16  . EKG 12-Lead  . EKG 12-Lead  . ED EKG 12-Lead  . ED EKG 12-Lead    ASSESSMENT AND PLAN:  Principal Problem:   Sepsis (HCC) Active Problems:   Acute pancreatitis  #1. Escherichia coli sepsis, continue Cefazolin, change to Keflex on  discharge,  urine analysis was unremarkable, suspected cholangitis ,  appreciate gastroenterology consultation,   improved clinically. Initiate ursodiol. MRCP could not have been done due to defibrillator #2 Septic shock, resolved, now off Levophed, off  IV fluids, following closely, blood pressure is high #3. Acute on chronic renal failure, appreciate nephrologist input, no hydronephrosis was noted on CT,left  renal mass has been investigated in 2016 by [facility], creatinine has improved, as well as urinary output, follow creatinine  closely #4. Hypoglycemia, resolved on D10 dextrose solution, due to  Sepsis, now off dextrose, blood glucose levels have improved #5. Hyperkalemia, resolved with bicarbonate drip, improved kidney function.  #6. Acute cholangitis, etiology is unclear, questionable passed stone, bilirubin has improved, as well as LFTs,, continue cefazolin, advance diet. Common bile duct on ultrasound was 4 mm, appreciate gastroenterologist and  surgery  recommendations, initiating Ursodiol, unable to get MRCP due to defibrillator #7. Acute pancreatitis, questionable duct stones/obstruction, lipase level is 150 today, slightly higher than yesterday, advancing diet to full liquid, check lipase in the morning #8. Lactic acidosis, resolved with conservative therapy #9. Thrombocytopenia, . Stable #10, porta hepatis mass versus lymph node, will need to have radiology studies repeated as outpatient  Management plans discussed with the patient, family and they are in agreement.   DRUG ALLERGIES:  Allergies  Allergen Reactions  . Sulfa Antibiotics Other (See Comments)    hyperkalemia  . Other Other (See Comments)    Agents that cause hyperkalemia  . Umeclidinium Other (See Comments)    Chest congestion  . Clarithromycin Hives  . Ramipril Cough  . Tiotropium Other (See Comments)    Chest congestion    CODE STATUS:     Code Status Orders        Start     Ordered   08/31/16 0544  Full code  Continuous     08/31/16 0543    Code Status History    Date Active Date Inactive Code Status Order ID Comments User Context   This patient has a current code status but no historical code status.      TOTAL TIME TAKING CARE OF THIS PATIENT: 40 minutes.  Discussed this patient's wife extensively, all questions were answered  Deziya Amero M.D on 09/02/2016 at 5:24 PM  Between 7am to 6pm - Pager - [phone]  After 6pm go to [email or web address] - password [email or web address] [facility]  Office  [phone]  CC:  Primary care physician; CROWDER, Pete Pelt, MD

## 2016-09-02 NOTE — Progress Notes (Signed)
PULMONARY / CRITICAL CARE MEDICINE   Name: Brent Silva MRN: [id number] DOB: 1946-03-15    ADMISSION DATE:  08/30/2016  SUBJECTIVE:  Patient states that he has been feeling better no new complaints this am.    Allergies  Allergen Reactions  . Sulfa Antibiotics Other (See Comments)    hyperkalemia  . Other Other (See Comments)    Agents that cause hyperkalemia  . Umeclidinium Other (See Comments)    Chest congestion  . Clarithromycin Hives  . Ramipril Cough  . Tiotropium Other (See Comments)    Chest congestion    No current facility-administered medications on file prior to encounter.    Current Outpatient Prescriptions on File Prior to Encounter  Medication Sig  . albuterol (VENTOLIN HFA) 108 (90 BASE) MCG/ACT inhaler Inhale 2 puffs into the lungs every 6 (six) hours as needed for wheezing.   Marland Kitchen. aspirin 81 MG tablet Take 81 mg by mouth daily.   Marland Kitchen. atorvastatin (LIPITOR) 40 MG tablet Take 40 mg by mouth at bedtime.   Marland Kitchen. doxazosin (CARDURA) 2 MG tablet Take 2 mg by mouth daily.   . Fluticasone-Salmeterol (ADVAIR DISKUS) 500-50 MCG/DOSE AEPB Inhale 1 puff into the lungs 2 (two) times daily.   Marland Kitchen. glipiZIDE (GLUCOTROL) 10 MG tablet Take 20 mg by mouth 2 (two) times daily before a meal.   . Insulin Glargine (LANTUS SOLOSTAR) 100 UNIT/ML Solostar Pen Inject 70 Units into the skin at bedtime.   Marland Kitchen. loratadine (CLARITIN) 10 MG tablet Take 10 mg by mouth daily as needed.   . metoprolol succinate (TOPROL-XL) 100 MG 24 hr tablet Take 100 mg by mouth daily.   . nitroGLYCERIN (NITROSTAT) 0.4 MG SL tablet Place 0.4 mg under the tongue every 5 (five) minutes as needed for chest pain.   Marland Kitchen. omeprazole (PRILOSEC) 20 MG capsule Take 20 mg by mouth 2 (two) times daily before a meal.   . Polyethylene Glycol 3350 POWD Take 1 Dose by mouth daily as needed.   . pregabalin (LYRICA) 75 MG capsule One po QD for one week, then one po Bid for one week, then one po tid  . ranolazine (RANEXA) 1000 MG SR tablet  Take 1,000 mg by mouth 2 (two) times daily.   . tamsulosin (FLOMAX) 0.4 MG CAPS capsule Take 0.4 mg by mouth daily.   Marland Kitchen. losartan (COZAAR) 25 MG tablet Take 12.5 mg by mouth daily.   . nitroGLYCERIN (NITRODUR - DOSED IN MG/24 HR) 0.4 mg/hr patch Place onto the skin.    REVIEW OF SYSTEMS:   Review of Systems  Constitutional: Positive for chills, fever and malaise/fatigue.  HENT: Negative for congestion, ear discharge, ear pain, nosebleeds and sinus pain.   Eyes: Negative for photophobia, pain and discharge.  Respiratory: Negative for shortness of breath, wheezing and stridor.   Cardiovascular: Negative for palpitations, orthopnea and leg swelling.  Gastrointestinal: Positive for abdominal pain and nausea. Negative for blood in stool, constipation and diarrhea.  Genitourinary: Negative for frequency, hematuria and urgency.  Musculoskeletal: Negative for back pain and neck pain.  Skin: Negative for itching and rash.  Neurological: Negative for tremors, sensory change, speech change and weakness.  Endo/Heme/Allergies: Negative for environmental allergies and polydipsia.  Psychiatric/Behavioral: Negative for hallucinations, substance abuse and suicidal ideas. The patient is not nervous/anxious    VITAL SIGNS: BP 93/78   Pulse 66   Temp 98.1 F (36.7 C) (Oral)   Resp 14   Ht 5\' 11"  (1.803 m)   Wt (!) 140.1  kg (308 lb 13.8 oz)   SpO2 94%   BMI 43.08 kg/m   HEMODYNAMICS:    VENTILATOR SETTINGS:    INTAKE / OUTPUT: I/O last 3 completed shifts: In: 2122.7 [I.V.:1822.7; IV Piggyback:300] Out: 5575 [Urine:5575]  PHYSICAL EXAMINATION: General:  Morbidly obese, elderly genleman, in no acute distress Neuro:  Awake , alert, oriented, no focal deficits  HEENT: Atraumatic, normocephalic, no discharge , no JVD appreciated Cardiovascular:  S1S2, regular, no MRG noted Lungs:  Diminished, no wheezes, crackles, rhonchi noted Abdomen: Firm, active bowel sounds Musculoskeletal:  No  inflammation/deformity noted Skin: Grossly intact  LABS:  BMET  Recent Labs Lab 08/31/16 2011 09/01/16 0457 09/02/16 0557  NA 135 138 137  K 4.1 4.5 4.4  CL 105 104 107  CO2 24 29 25   BUN 49* 46* 39*  CREATININE 3.95* 3.47* 2.71*  GLUCOSE 255* 228* 157*    Electrolytes  Recent Labs Lab 08/31/16 2011 09/01/16 0457 09/02/16 0557  CALCIUM 7.8* 7.8* 8.5*    CBC  Recent Labs Lab 08/31/16 0016 08/31/16 0701 09/01/16 0457 09/02/16 0557  WBC 6.2 9.4 4.1  --   HGB 13.0 12.8* 11.7*  --   HCT 38.0* 37.5* 34.3*  --   PLT 93* 92* 74* 73*    Coag's  Recent Labs Lab 08/31/16 0701  INR 1.11    Sepsis Markers  Recent Labs Lab 08/31/16 0016 08/31/16 0405 08/31/16 0701  LATICACIDVEN 1.9 2.0* 1.2    ABG No results for input(s): PHART, PCO2ART, PO2ART in the last 168 hours.  Liver Enzymes  Recent Labs Lab 08/31/16 0701 08/31/16 2011 09/01/16 0457  AST 109* 65* 53*  ALT 87* 69* 62  ALKPHOS 194* 162* 155*  BILITOT 3.8* 2.3* 1.5*  ALBUMIN 3.6 3.3* 3.1*    Cardiac Enzymes No results for input(s): TROPONINI, PROBNP in the last 168 hours.  Glucose  Recent Labs Lab 09/01/16 1130 09/01/16 1625 09/01/16 2000 09/01/16 2355 09/02/16 0457 09/02/16 0742  GLUCAP 122* 170* 198* 212* 144* 140*    Imaging No results found.   STUDIES:  12/13 CT head>>No evidence of large acute infarct, intracranial hemorrhage, orfocal mass effect  12/13Ct abdomen>>mild stranding of peripancreatic fat especially in the region of pancreatic head and body of the pancreas, pathologic portacaval lymph, node measures 2.4 cm, surgically absent gallbladder  12/13Abd  ultrasound>>2.0x2.1x1.8cm left kidney mas, pancreas obscured by gas  CULTURES: 12/13 blood culture>>e.coli 12/13 Urine culture>>NTD  ANTIBIOTICS: 12/13>> Zosyn 12/13>>meropenem   SIGNIFICANT EVENTS: 12/13 Patient admitted to the ICU with sepsis secondary to pancreatitis  And  hypotension  LINES/TUBES:  12/13 RIJ CVL   ASSESSMENT / PLAN:  ASSESSMENT / PLAN:  PULMONARY A: Hx of COPD P:   Continue to support with O2  To keep sats >92% Bronchodilators  CARDIOVASCULAR A:  CAD CHF Hypotension secondary to possible sepsis- improved.  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  GASTROINTESTINAL A:   Elevated lipase, now normalized Acute pancreatitis Acute cholangitis- bilirubin trending down Elevated LFT'S Elevated ammonia Portal caval node/mass 2.4cm P: NPO   Right upper quadrant ultrasound-2.0x2.1x1.8cm left kidney mas, pancreas d/w surgery, needs MRCP to further evaluate portal caval node.  HEMATOLOGIC A:   No active issues P:  Heparin for DVT prophylaxis Will transfuse per ICU  guidelines  INFECTIOUS A:   Sepsis secondary to pancreatitis P:   Monitor fever curve Zosyn Follow cultures Will f/u on CT abdomen and right upper quadrant ultrasound   ENDOCRINE A:   DM  P:   BS checks with SSI coverage D-10@50   NEUROLOGIC A:   Acute encephalopathy P:   Frequent. Reorientation Minimize sedating drugs  -Wells Guiles, M.D.   09/02/2016  Critical Care Attestation.  I have personally obtained a history, examined the patient, evaluated laboratory and imaging results, formulated the assessment and plan and placed orders. The Patient requires high complexity decision making for assessment and support, frequent evaluation and titration of therapies, application of advanced monitoring technologies and extensive interpretation of multiple databases. The patient has critical illness that could lead imminently to failure of 1 or more organ systems and requires  the highest level of physician preparedness to intervene.  Critical Care Time devoted to patient care services described in this note is 45 minutes and is exclusive of time spent in procedures.

## 2016-09-02 NOTE — Progress Notes (Signed)
Subjective:   Patient is feeling better today Urine output 4100 cc Serum creatinine has improved down to 2.71 today  Objective:  Vital signs in last 24 hours:  Temp:  [98 F (36.7 C)-99 F (37.2 C)] 98 F (36.7 C) (12/15 1300) Pulse Rate:  [63-88] 66 (12/15 0600) Resp:  [13-20] 14 (12/15 1300) BP: (93-145)/(45-110) 93/78 (12/15 0600) SpO2:  [89 %-97 %] 94 % (12/15 0600)  Weight change:  Filed Weights   08/31/16 0003 08/31/16 0530  Weight: (!) 139.7 kg (308 lb) (!) 140.1 kg (308 lb 13.8 oz)    Intake/Output:    Intake/Output Summary (Last 24 hours) at 09/02/16 1443 Last data filed at 09/02/16 1307  Gross per 24 hour  Intake             1030 ml  Output             3620 ml  Net            -2590 ml     Physical Exam: General: No acute distress, laying in the bed   HEENT Anicteric,  Neck Supple   Pulm/lungs Normal breathing effort, clear to auscultation bilaterally   CVS/Heart No rub or gallop   Abdomen:  Soft, nontender, nondistended   Extremities: Trace peripheral edema   Neurologic: Alert, oriented, mild tremors   Skin: No acute rashes    Foley in place        Basic Metabolic Panel:   Recent Labs Lab 08/31/16 0701 08/31/16 1230 08/31/16 2011 09/01/16 0457 09/02/16 0557  NA 136 135 135 138 137  K 5.3* 5.1 4.1 4.5 4.4  CL 106 105 105 104 107  CO2 23 24 24 29 25   GLUCOSE 108* 131* 255* 228* 157*  BUN 46* 52* 49* 46* 39*  CREATININE 3.50* 3.99* 3.95* 3.47* 2.71*  CALCIUM 8.1* 7.7* 7.8* 7.8* 8.5*     CBC:  Recent Labs Lab 08/31/16 0016 08/31/16 0701 09/01/16 0457 09/02/16 0557  WBC 6.2 9.4 4.1  --   NEUTROABS 5.8  --   --   --   HGB 13.0 12.8* 11.7*  --   HCT 38.0* 37.5* 34.3*  --   MCV 94.3 94.9 94.6  --   PLT 93* 92* 74* 73*      Microbiology:  Recent Results (from the past 720 hour(s))  Blood Culture (routine x 2)     Status: Abnormal   Collection Time: 08/31/16 12:16 AM  Result Value Ref Range Status   Specimen Description  BLOOD RIGHT AC  Final   Special Requests BOTTLES DRAWN AEROBIC AND ANAEROBIC ANA11CC AER8CC  Final   Culture  Setup Time   Final    GRAM NEGATIVE RODS IN BOTH AEROBIC AND ANAEROBIC BOTTLES CRITICAL RESULT CALLED TO, READ BACK BY AND VERIFIED WITH: CHRISTINE KATSOUDAS 08/31/16 1202 SGD Performed at [facility] [place]    Culture ESCHERICHIA COLI (A)  Final   Report Status 09/02/2016 FINAL  Final   Organism ID, Bacteria ESCHERICHIA COLI  Final      Susceptibility   Escherichia coli - MIC*    AMPICILLIN <=2 SENSITIVE Sensitive     CEFAZOLIN <=4 SENSITIVE Sensitive     CEFEPIME <=1 SENSITIVE Sensitive     CEFTAZIDIME <=1 SENSITIVE Sensitive     CEFTRIAXONE <=1 SENSITIVE Sensitive     CIPROFLOXACIN <=0.25 SENSITIVE Sensitive     GENTAMICIN <=1 SENSITIVE Sensitive     IMIPENEM <=0.25 SENSITIVE Sensitive     TRIMETH/SULFA <=20 SENSITIVE  Sensitive     AMPICILLIN/SULBACTAM <=2 SENSITIVE Sensitive     PIP/TAZO <=4 SENSITIVE Sensitive     Extended ESBL NEGATIVE Sensitive     * ESCHERICHIA COLI  Blood Culture ID Panel (Reflexed)     Status: Abnormal   Collection Time: 08/31/16 12:16 AM  Result Value Ref Range Status   Enterococcus species NOT DETECTED NOT DETECTED Final   Listeria monocytogenes NOT DETECTED NOT DETECTED Final   Staphylococcus species NOT DETECTED NOT DETECTED Final   Staphylococcus aureus NOT DETECTED NOT DETECTED Final   Streptococcus species NOT DETECTED NOT DETECTED Final   Streptococcus agalactiae NOT DETECTED NOT DETECTED Final   Streptococcus pneumoniae NOT DETECTED NOT DETECTED Final   Streptococcus pyogenes NOT DETECTED NOT DETECTED Final   Acinetobacter baumannii NOT DETECTED NOT DETECTED Final   Enterobacteriaceae species DETECTED (A) NOT DETECTED Final    Comment: CRITICAL RESULT CALLED TO, READ BACK BY AND VERIFIED WITH: CHRISTINE KATSOUDAS 08/31/16 1202 SGD    Enterobacter cloacae complex NOT DETECTED NOT DETECTED Final   Escherichia coli DETECTED (A) NOT  DETECTED Final    Comment: CRITICAL RESULT CALLED TO, READ BACK BY AND VERIFIED WITH: CHRISTINE KATSOUDAS 08/31/16 1202 SGD    Klebsiella oxytoca NOT DETECTED NOT DETECTED Final   Klebsiella pneumoniae NOT DETECTED NOT DETECTED Final   Proteus species NOT DETECTED NOT DETECTED Final   Serratia marcescens NOT DETECTED NOT DETECTED Final   Carbapenem resistance NOT DETECTED NOT DETECTED Final   Haemophilus influenzae NOT DETECTED NOT DETECTED Final   Neisseria meningitidis NOT DETECTED NOT DETECTED Final   Pseudomonas aeruginosa NOT DETECTED NOT DETECTED Final   Candida albicans NOT DETECTED NOT DETECTED Final   Candida glabrata NOT DETECTED NOT DETECTED Final   Candida krusei NOT DETECTED NOT DETECTED Final   Candida parapsilosis NOT DETECTED NOT DETECTED Final   Candida tropicalis NOT DETECTED NOT DETECTED Final  Blood Culture (routine x 2)     Status: Abnormal   Collection Time: 08/31/16 12:17 AM  Result Value Ref Range Status   Specimen Description BLOOD LEFT AC  Final   Special Requests   Final    BOTTLES DRAWN AEROBIC AND ANAEROBIC AER11CC ANA10CC   Culture  Setup Time   Final    GRAM NEGATIVE RODS IN BOTH AEROBIC AND ANAEROBIC BOTTLES CRITICAL RESULT CALLED TO, READ BACK BY AND VERIFIED WITH: CHRISTINE KATSOUDAS 08/31/16 1202 SGD    Culture (A)  Final    ESCHERICHIA COLI SUSCEPTIBILITIES PERFORMED ON PREVIOUS CULTURE WITHIN THE LAST 5 DAYS. Performed at [facility]    Report Status 09/02/2016 FINAL  Final  MRSA PCR Screening     Status: None   Collection Time: 08/31/16  5:48 AM  Result Value Ref Range Status   MRSA by PCR NEGATIVE NEGATIVE Final    Comment:        The GeneXpert MRSA Assay (FDA approved for NASAL specimens only), is one component of a comprehensive MRSA colonization surveillance program. It is not intended to diagnose MRSA infection nor to guide or monitor treatment for MRSA infections.   Urine culture     Status: None   Collection Time:  08/31/16 12:46 PM  Result Value Ref Range Status   Specimen Description URINE, RANDOM  Final   Special Requests NONE  Final   Culture NO GROWTH Performed at [facility]   Final   Report Status 09/01/2016 FINAL  Final    Coagulation Studies:  Recent Labs  08/31/16 0701  LABPROT  14.3  INR 1.11    Urinalysis:  Recent Labs  08/31/16 1246  COLORURINE AMBER*  LABSPEC 1.020  PHURINE 5.0  GLUCOSEU 50*  HGBUR NEGATIVE  BILIRUBINUR NEGATIVE  KETONESUR NEGATIVE  PROTEINUR 30*  NITRITE NEGATIVE  LEUKOCYTESUR NEGATIVE      Imaging: No results found.   Medications:   . norepinephrine Stopped (09/01/16 0222)   .  ceFAZolin (ANCEF) IV  2 g Intravenous Q8H  . LORazepam  1 mg Intravenous Once  . mouth rinse  15 mL Mouth Rinse BID  . mometasone-formoterol  2 puff Inhalation BID  .  morphine injection  1 mg Intravenous Once  . pantoprazole (PROTONIX) IV  40 mg Intravenous Q24H  . pregabalin  75 mg Oral Daily  . sodium chloride flush  3 mL Intravenous Q12H   acetaminophen **OR** acetaminophen, albuterol, ondansetron **OR** ondansetron (ZOFRAN) IV  Assessment/ Plan:  70 y.o. Caucasian male with  medical problems of Long-standing diabetes for over 15 years, coronary disease with MI in 2000 with angioplasty[facility], defibrillator pacemaker placement, chronic systolic congestive heart failure with EF of 25% COPD, chronic kidney disease stage IV, BPH, [facility], history of pancreatitis in the past, who was [facility] for evaluation of epigastric abdominal pain, fever, confusion that started yesterday afternoon.  1. Acute renal failure on chronic kidney disease stage IV Baseline creatinine 2.7/GFR 23. [facility] Acute renal failure is likely secondary to prerenal state and possibly due to hypotension Patient was given volume resuscitation for hypotension and volume depletion. His blood pressure has  improved. However, he is still requiring pressors. He is currently getting infusion of D5- normal saline as well as sodium bicarbonate.   Avoid hypotension, nephrotoxins Maintain hemodynamic stability Avoid ibuprofen UOP and S Creatinine have improved. S Cr at baseline  2. Mild hyperkalemia Monitor closely  3. Acute pancreatitis Pain is resolved.  4. E Coli sepsis - Abx as per IM team  5. B/L Renal Masses - chronic issue, [facility]  6. Nephrolithiasis nonobstructive cortical calcification in midpole of the right kidney anteriorly measures 3.5 mm - monitor   LOS: 2 Janney Priego 12/15/20172:43 PM

## 2016-09-02 NOTE — Progress Notes (Signed)
Patient received from CCU via recliner. Tele removed and sent pack with transport. Chart at bedside. A&O x4. Oriented to room, call light, TV and bed controls.

## 2016-09-02 NOTE — Evaluation (Signed)
Physical Therapy Evaluation Patient Details Name: Brent Silva MRN: [id number] DOB: 1945-10-29 Today's Date: 09/02/2016   History of Present Illness  Pt is a 70 y/o M admitted to the ICU for sepsis as well as elevation of lipase and pancreatitis.  Pressors dicontinued on 12/14. Pt presented with acute encephalopathy while in the ICU which has cleared since.  CT head unremarkable for acute abnormality. Pt's PMH includes ESRD, COPD, MI, CHF, PVD, cryptococcal meningitis.    Clinical Impression  Pt admitted with above diagnosis. Pt currently with functional limitations due to the deficits listed below (see PT Problem List). 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  He currently requires supervision for transfers and up to minA while ambulating due to unsteadiness. Pt scored a 39/56 on the [employer], indicating pt has a increased risk of falling.  Results of test explained to the pt. SpO2 as low was 83% on RA while ambulating. Applied 1L via [place] and SpO2 improved quickly to upper 90s.  Pt left on RA at end of session, as pt was received, with SpO2 reading 99%.  RN notified.  Pt will benefit from skilled PT to increase their independence and safety with mobility to allow discharge to the venue listed below.      Follow Up Recommendations Outpatient PT (to address balance impairments)    Equipment Recommendations  None recommended by PT    Recommendations for Other Services       Precautions / Restrictions Precautions Precautions: Other (comment) Precaution Comments: monitor BP Restrictions Weight Bearing Restrictions: No      Mobility  Bed Mobility Overal bed mobility: Modified Independent             General bed mobility comments: Increased time and effort but no physical assist needed.  Pt reports mild dizziness upon sitting which clears within 20 seconds.  Transfers Overall transfer level: Needs assistance Equipment used: None Transfers: Sit to/from  Stand Sit to Stand: Supervision         General transfer comment: Supervision for safety as it was pt's first time OOB.  Pt denies dizziness.  Pt stood from bed x1, commode x1, and chair x1.  Ambulation/Gait Ambulation/Gait assistance: Min assist Ambulation Distance (Feet): 150 Feet Assistive device: None Gait Pattern/deviations: Step-through pattern;Wide base of support;Staggering left;Staggering right Gait velocity: decreased Gait velocity interpretation: Below normal speed for age/gender General Gait Details: Pt unsteady and LOB x2 requiring minA to steady.  Pt staggers with horizontal and vertical head turns.  SpO2 as low was 83% on RA while ambulating. Applied 1L via [place] and SpO2 improved quickly to upper 90s.  Pt left on RA at end of session, as pt was received, with SpO2 reading 99%.  RN notified.  Stairs            Wheelchair Mobility    Modified Rankin (Stroke Patients Only)       Balance Overall balance assessment: Needs assistance Sitting-balance support: No upper extremity supported;Feet supported Sitting balance-Leahy Scale: Good     Standing balance support: No upper extremity supported;During functional activity Standing balance-Leahy Scale: Fair Standing balance comment: Able to stand statically without UE support but unsteady with dynamic activities                 Standardized Balance Assessment Standardized Balance Assessment : Berg Balance Test Berg Balance Test Sit to Stand: Able to stand without using hands and stabilize independently Standing Unsupported: Able to stand safely 2  minutes Sitting with Back Unsupported but Feet Supported on Floor or Stool: Able to sit safely and securely 2 minutes Stand to Sit: Sits safely with minimal use of hands Transfers: Able to transfer safely, minor use of hands Standing Unsupported with Eyes Closed: Able to stand 10 seconds with supervision Standing Ubsupported with Feet Together: Able to place feet  together independently and stand for 1 minute with supervision From Standing, Reach Forward with Outstretched Arm: Can reach forward >12 cm safely (5") From Standing Position, Pick up Object from Floor: Able to pick up shoe safely and easily From Standing Position, Turn to Look Behind Over each Shoulder: Looks behind from both sides and weight shifts well Turn 360 Degrees: Able to turn 360 degrees safely but slowly Standing Unsupported, Alternately Place Feet on Step/Stool: Needs assistance to keep from falling or unable to try Standing Unsupported, One Foot in Front: Loses balance while stepping or standing Standing on One Leg: Unable to try or needs assist to prevent fall Total Score: 39         Pertinent Vitals/Pain Pain Assessment: No/denies pain    Home Living Family/patient expects to be discharged to:: Private residence Living Arrangements: Spouse/significant other Available Help at Discharge: Family;Available 24 hours/day Type of Home: House Home Access: Stairs to enter Entrance Stairs-Rails: Left;Right;Can reach both Entrance Stairs-Number of Steps: 2 Home Layout: One level Home Equipment: None      Prior Function Level of Independence: Independent         Comments: Pt was ind not using AD PTA.  He was working part time delivering flowers.     Hand Dominance        Extremity/Trunk Assessment   Upper Extremity Assessment Upper Extremity Assessment: Overall WFL for tasks assessed    Lower Extremity Assessment Lower Extremity Assessment: Overall WFL for tasks assessed       Communication   Communication: No difficulties  Cognition Arousal/Alertness: Awake/alert Behavior During Therapy: WFL for tasks assessed/performed Overall Cognitive Status: Within Functional Limits for tasks assessed                      General Comments General comments (skin integrity, edema, etc.): BP supine at start of session 145/67 and seated at end of session 149/68.   Pt scored a 39/56 on the [employer] Test, indicating pt has a increased risk of falling.  Results of test explained to the pt.    Exercises General Exercises - Lower Extremity Ankle Circles/Pumps: AROM;Both;10 reps;Supine Quad Sets: Strengthening;Both;10 reps;Supine Straight Leg Raises: Strengthening;Both;10 reps;Supine Other Exercises Other Exercises: Cues and demonstration for pursed lip breathing and pt encouraged to perform during session and during the day   Assessment/Plan    PT Assessment Patient needs continued PT services  PT Problem List Decreased strength;Decreased activity tolerance;Decreased balance;Decreased knowledge of use of DME;Decreased safety awareness;Cardiopulmonary status limiting activity          PT Treatment Interventions DME instruction;Gait training;Stair training;Functional mobility training;Therapeutic activities;Therapeutic exercise;Balance training;Patient/family education    PT Goals (Current goals can be found in the Care Plan section)  Acute Rehab PT Goals Patient Stated Goal: to go home to his dog PT Goal Formulation: With patient/family Time For Goal Achievement: 09/16/16 Potential to Achieve Goals: Good    Frequency Min 2X/week   Barriers to discharge        Co-evaluation               End of Session Equipment Utilized During Treatment:  Gait belt;Oxygen Activity Tolerance: Patient tolerated treatment well;Patient limited by fatigue Patient left: in chair;with call bell/phone within reach;Other (comment) (no chair alarm available, pt agreed assist with OOB/chair) Nurse Communication: Mobility status;Other (comment) (SpO2, BP)         Time: [id number] PT Time Calculation (min) (ACUTE ONLY): 46 min   Charges:   PT Evaluation $PT Eval Low Complexity: 1 Procedure PT Treatments $Gait Training: 8-22 mins $Therapeutic Activity: 8-22 mins   PT G Codes:        Encarnacion ChuAshley Abashian PT, DPT 09/02/2016, 2:41 PM

## 2016-09-03 ENCOUNTER — Inpatient Hospital Stay: Payer: Medicare Other

## 2016-09-03 LAB — CBC
HCT: 36.6 % — ABNORMAL LOW (ref 40.0–52.0)
HEMOGLOBIN: 12.5 g/dL — AB (ref 13.0–18.0)
MCH: 32.3 pg (ref 26.0–34.0)
MCHC: 34.3 g/dL (ref 32.0–36.0)
MCV: 94.3 fL (ref 80.0–100.0)
PLATELETS: 83 10*3/uL — AB (ref 150–440)
RBC: 3.88 MIL/uL — AB (ref 4.40–5.90)
RDW: 13.9 % (ref 11.5–14.5)
WBC: 3 10*3/uL — AB (ref 3.8–10.6)

## 2016-09-03 LAB — COMPREHENSIVE METABOLIC PANEL
ALBUMIN: 3.2 g/dL — AB (ref 3.5–5.0)
ALK PHOS: 148 U/L — AB (ref 38–126)
ALT: 35 U/L (ref 17–63)
AST: 24 U/L (ref 15–41)
Anion gap: 5 (ref 5–15)
BUN: 30 mg/dL — ABNORMAL HIGH (ref 6–20)
CHLORIDE: 109 mmol/L (ref 101–111)
CO2: 26 mmol/L (ref 22–32)
CREATININE: 1.94 mg/dL — AB (ref 0.61–1.24)
Calcium: 8.9 mg/dL (ref 8.9–10.3)
GFR calc non Af Amer: 33 mL/min — ABNORMAL LOW (ref >60)
GFR, EST AFRICAN AMERICAN: 38 mL/min — AB (ref >60)
GLUCOSE: 138 mg/dL — AB (ref 65–99)
Potassium: 4.5 mmol/L (ref 3.5–5.1)
SODIUM: 140 mmol/L (ref 135–145)
Total Bilirubin: 1 mg/dL (ref 0.3–1.2)
Total Protein: 6.4 g/dL — ABNORMAL LOW (ref 6.5–8.1)

## 2016-09-03 LAB — GLUCOSE, CAPILLARY
GLUCOSE-CAPILLARY: 136 mg/dL — AB (ref 65–99)
GLUCOSE-CAPILLARY: 138 mg/dL — AB (ref 65–99)
GLUCOSE-CAPILLARY: 147 mg/dL — AB (ref 65–99)
GLUCOSE-CAPILLARY: 183 mg/dL — AB (ref 65–99)
Glucose-Capillary: 147 mg/dL — ABNORMAL HIGH (ref 65–99)
Glucose-Capillary: 120 mg/dL — ABNORMAL HIGH (ref 65–99)

## 2016-09-03 LAB — LIPASE, BLOOD: Lipase: 126 U/L — ABNORMAL HIGH (ref 11–51)

## 2016-09-03 NOTE — Progress Notes (Signed)
Shift assessment completed at 0745. Pt awake, alert and oriented at that time, [facility] and speaks slowly, is cooperative. Skin is pink,warm and dry. Pt denied pain on assessment, but stated he has had a headache intermittently since admission. Pt is on room air, lungs are clear bilat, Hr is regular, abdomen is soft, bs heard. Pt is voiding clear yellow urine in urinal,ppp, no edema noted. PIV #20 intact to lac, site is free of redness and swelling. Pt able to use call bell. Since admission, pt has received am meds, has offered no complaints.

## 2016-09-03 NOTE — Plan of Care (Signed)
Problem: Pain Managment: Goal: General experience of comfort will improve Outcome: Progressing Complaints of headache controled with oral pain medications.  Problem: Activity: Goal: Risk for activity intolerance will decrease Outcome: Progressing Pt able to ambulate with steady gate in room with standby assist.  Problem: Fluid Volume: Goal: Ability to maintain a balanced intake and output will improve Outcome: Progressing Eating and drinking without difficulty.

## 2016-09-03 NOTE — Plan of Care (Signed)
Problem: Bowel/Gastric: Goal: Will not experience complications related to bowel motility Outcome: Progressing Pt is progressing toward goals for discharge. Pt has remained free of falls/injury this shift.

## 2016-09-03 NOTE — Progress Notes (Signed)
Subjective:   Patient is feeling better today Walking around in the room Urine output 3250 cc Serum creatinine has improved down to 1.94 today  Objective:  Vital signs in last 24 hours:  Temp:  [97.5 F (36.4 C)-98.1 F (36.7 C)] 97.7 F (36.5 C) (12/16 1355) Pulse Rate:  [62-71] 62 (12/16 1355) Resp:  [18-20] 20 (12/16 0433) BP: (136-159)/(59-69) 153/69 (12/16 1355) SpO2:  [92 %-99 %] 97 % (12/16 1355)  Weight change:  Filed Weights   08/31/16 0003 08/31/16 0530  Weight: (!) 139.7 kg (308 lb) (!) 140.1 kg (308 lb 13.8 oz)    Intake/Output:    Intake/Output Summary (Last 24 hours) at 09/03/16 1445 Last data filed at 09/03/16 1000  Gross per 24 hour  Intake              800 ml  Output             2405 ml  Net            -1605 ml     Physical Exam: General: No acute distress, Sitting up in recliner chair   HEENT Anicteric,  Neck Supple   Pulm/lungs Normal breathing effort, clear to auscultation bilaterally   CVS/Heart No rub or gallop   Abdomen:  Soft, nontender, nondistended   Extremities: Trace peripheral edema   Neurologic: Alert, oriented, mild tremors   Skin: No acute rashes            Basic Metabolic Panel:   Recent Labs Lab 08/31/16 1230 08/31/16 2011 09/01/16 0457 09/02/16 0557 09/03/16 0342  NA 135 135 138 137 140  K 5.1 4.1 4.5 4.4 4.5  CL 105 105 104 107 109  CO2 24 24 29 25 26   GLUCOSE 131* 255* 228* 157* 138*  BUN 52* 49* 46* 39* 30*  CREATININE 3.99* 3.95* 3.47* 2.71* 1.94*  CALCIUM 7.7* 7.8* 7.8* 8.5* 8.9     CBC:  Recent Labs Lab 08/31/16 0016 08/31/16 0701 09/01/16 0457 09/02/16 0557 09/03/16 0342  WBC 6.2 9.4 4.1  --  3.0*  NEUTROABS 5.8  --   --   --   --   HGB 13.0 12.8* 11.7*  --  12.5*  HCT 38.0* 37.5* 34.3*  --  36.6*  MCV 94.3 94.9 94.6  --  94.3  PLT 93* 92* 74* 73* 83*      Microbiology:  Recent Results (from the past 720 hour(s))  Blood Culture (routine x 2)     Status: Abnormal   Collection Time:  08/31/16 12:16 AM  Result Value Ref Range Status   Specimen Description BLOOD RIGHT AC  Final   Special Requests BOTTLES DRAWN AEROBIC AND ANAEROBIC ANA11CC AER8CC  Final   Culture  Setup Time   Final    GRAM NEGATIVE RODS IN BOTH AEROBIC AND ANAEROBIC BOTTLES CRITICAL RESULT CALLED TO, READ BACK BY AND VERIFIED WITH: CHRISTINE KATSOUDAS 08/31/16 1202 SGD Performed [facility]    Culture ESCHERICHIA COLI (A)  Final   Report Status 09/02/2016 FINAL  Final   Organism ID, Bacteria ESCHERICHIA COLI  Final      Susceptibility   Escherichia coli - MIC*    AMPICILLIN <=2 SENSITIVE Sensitive     CEFAZOLIN <=4 SENSITIVE Sensitive     CEFEPIME <=1 SENSITIVE Sensitive     CEFTAZIDIME <=1 SENSITIVE Sensitive     CEFTRIAXONE <=1 SENSITIVE Sensitive     CIPROFLOXACIN <=0.25 SENSITIVE Sensitive     GENTAMICIN <=1 SENSITIVE Sensitive  IMIPENEM <=0.25 SENSITIVE Sensitive     TRIMETH/SULFA <=20 SENSITIVE Sensitive     AMPICILLIN/SULBACTAM <=2 SENSITIVE Sensitive     PIP/TAZO <=4 SENSITIVE Sensitive     Extended ESBL NEGATIVE Sensitive     * ESCHERICHIA COLI  Blood Culture ID Panel (Reflexed)     Status: Abnormal   Collection Time: 08/31/16 12:16 AM  Result Value Ref Range Status   Enterococcus species NOT DETECTED NOT DETECTED Final   Listeria monocytogenes NOT DETECTED NOT DETECTED Final   Staphylococcus species NOT DETECTED NOT DETECTED Final   Staphylococcus aureus NOT DETECTED NOT DETECTED Final   Streptococcus species NOT DETECTED NOT DETECTED Final   Streptococcus agalactiae NOT DETECTED NOT DETECTED Final   Streptococcus pneumoniae NOT DETECTED NOT DETECTED Final   Streptococcus pyogenes NOT DETECTED NOT DETECTED Final   Acinetobacter baumannii NOT DETECTED NOT DETECTED Final   Enterobacteriaceae species DETECTED (A) NOT DETECTED Final    Comment: CRITICAL RESULT CALLED TO, READ BACK BY AND VERIFIED WITH: CHRISTINE KATSOUDAS 08/31/16 1202 SGD    Enterobacter cloacae  complex NOT DETECTED NOT DETECTED Final   Escherichia coli DETECTED (A) NOT DETECTED Final    Comment: CRITICAL RESULT CALLED TO, READ BACK BY AND VERIFIED WITH: CHRISTINE KATSOUDAS 08/31/16 1202 SGD    Klebsiella oxytoca NOT DETECTED NOT DETECTED Final   Klebsiella pneumoniae NOT DETECTED NOT DETECTED Final   Proteus species NOT DETECTED NOT DETECTED Final   Serratia marcescens NOT DETECTED NOT DETECTED Final   Carbapenem resistance NOT DETECTED NOT DETECTED Final   Haemophilus influenzae NOT DETECTED NOT DETECTED Final   Neisseria meningitidis NOT DETECTED NOT DETECTED Final   Pseudomonas aeruginosa NOT DETECTED NOT DETECTED Final   Candida albicans NOT DETECTED NOT DETECTED Final   Candida glabrata NOT DETECTED NOT DETECTED Final   Candida krusei NOT DETECTED NOT DETECTED Final   Candida parapsilosis NOT DETECTED NOT DETECTED Final   Candida tropicalis NOT DETECTED NOT DETECTED Final  Blood Culture (routine x 2)     Status: Abnormal   Collection Time: 08/31/16 12:17 AM  Result Value Ref Range Status   Specimen Description BLOOD LEFT AC  Final   Special Requests   Final    BOTTLES DRAWN AEROBIC AND ANAEROBIC AER11CC ANA10CC   Culture  Setup Time   Final    GRAM NEGATIVE RODS IN BOTH AEROBIC AND ANAEROBIC BOTTLES CRITICAL RESULT CALLED TO, READ BACK BY AND VERIFIED WITH: CHRISTINE KATSOUDAS 08/31/16 1202 SGD    Culture (A)  Final    ESCHERICHIA COLI SUSCEPTIBILITIES PERFORMED ON PREVIOUS CULTURE WITHIN THE LAST 5 DAYS. Performed at [facility]    Report Status 09/02/2016 FINAL  Final  MRSA PCR Screening     Status: None   Collection Time: 08/31/16  5:48 AM  Result Value Ref Range Status   MRSA by PCR NEGATIVE NEGATIVE Final    Comment:        The GeneXpert MRSA Assay (FDA approved for NASAL specimens only), is one component of a comprehensive MRSA colonization surveillance program. It is not intended to diagnose MRSA infection nor to guide or monitor treatment  for MRSA infections.   Urine culture     Status: None   Collection Time: 08/31/16 12:46 PM  Result Value Ref Range Status   Specimen Description URINE, RANDOM  Final   Special Requests NONE  Final   Culture NO GROWTH Performed at [facility]   Final   Report Status 09/01/2016 FINAL  Final  Coagulation Studies: No results for input(s): LABPROT, INR in the last 72 hours.  Urinalysis: No results for input(s): COLORURINE, LABSPEC, PHURINE, GLUCOSEU, HGBUR, BILIRUBINUR, KETONESUR, PROTEINUR, UROBILINOGEN, NITRITE, LEUKOCYTESUR in the last 72 hours.  Invalid input(s): APPERANCEUR    Imaging: Dg Chest 1 View  Result Date: 09/03/2016 CLINICAL DATA:  Shortness of breath and chest tightness, 2-3 days duration. EXAM: CHEST 1 VIEW COMPARISON:  08/31/2016 FINDINGS: Previous median sternotomy and CABG. Pacemaker/ AICD remains in place. Chronic cardiomegaly. Mild interstitial prominence could reflect early interstitial edema. No consolidation, collapse or effusion. IMPRESSION: Cardiomegaly. CABG. Pacemaker AICD. Question early interstitial edema. Electronically Signed   By: Paulina Fusi M.D.   On: 09/03/2016 07:39     Medications:    .  ceFAZolin (ANCEF) IV  2 g Intravenous Q8H  . hydrALAZINE  25 mg Oral Q8H  . mouth rinse  15 mL Mouth Rinse BID  . mometasone-formoterol  2 puff Inhalation BID  .  morphine injection  1 mg Intravenous Once  . pantoprazole (PROTONIX) IV  40 mg Intravenous Q24H  . pregabalin  75 mg Oral Daily  . sodium chloride flush  3 mL Intravenous Q12H  . ursodiol  300 mg Oral BID   acetaminophen **OR** acetaminophen, albuterol, ibuprofen, ondansetron **OR** ondansetron (ZOFRAN) IV  Assessment/ Plan:  70 y.o. Caucasian male with  medical problems of Long-standing diabetes for over 15 years, coronary disease with MI in 2000 with angioplasty, CABG (3 vessel) at [facility] in 2005, defibrillator pacemaker placement, chronic systolic congestive heart failure with EF  of 25% COPD, chronic kidney disease stage IV, BPH, renal mass followed at [facility], history of pancreatitis in the past, who was admitted to [facility] on 08/30/2016 for evaluation of epigastric abdominal pain, fever, confusion that started yesterday afternoon.  1. Acute renal failure on chronic kidney disease stage 3 Acute renal failure is likely secondary to prerenal state and possibly due to hypotension  Serum creatinine today is 1.94, urine output 3250 cc This may be his new baseline with GFR 33, but will follow lab work daily   Avoid hypotension, nephrotoxins Maintain hemodynamic stability Avoid ibuprofen    2. Mild hyperkalemia Monitor closely  3. Acute pancreatitis Pain is resolved.  4. E Coli sepsis - Abx as per IM team  5. B/L Renal Masses - chronic issue, followed at [facility]  6. Nephrolithiasis nonobstructive cortical calcification in midpole of the right kidney anteriorly measures 3.5 mm - monitor   LOS: 3 Daleah Coulson 12/16/20172:45 PM

## 2016-09-03 NOTE — Progress Notes (Deleted)
Family members have arrived at bedside. Wife is asking for plan of care, this writer explained that elecrtolytes are better but not yet within normal range, supplementation will continue.Wife asking that something be done regarding pt's ascites in his scrotum. Wife stated that it has been there since July, primary physician is aware, and [facility]. This writer explained that this may not be addressed during this admission, primary may wish to have this drained as output if possible. Oral vancomycin will continue, stools appear to be thickening and slowing in frequency. This Clinical research associatewriter encouraged pt and family regarding probiotics, encouraged pt to eat yogurt that is sent to help restore gut bacteria and help pt's immune system.

## 2016-09-03 NOTE — Progress Notes (Signed)
[facility] [place] at [facility]   PATIENT NAME: Starlyn SkeansJerry Ponce    MR#:  [id number]  DATE OF BIRTH:  [birth year]  SUBJECTIVE:   Pt. Here due to AMS due to sepsis.  Pt. Has E. Coli sepsis but source unclear.  Feels much better and mental status back to baseline. No complaints presently.   REVIEW OF SYSTEMS:    Review of Systems  Constitutional: Negative for chills and fever.  HENT: Negative for congestion and tinnitus.   Eyes: Negative for blurred vision and double vision.  Respiratory: Negative for cough, shortness of breath and wheezing.   Cardiovascular: Negative for chest pain, orthopnea and PND.  Gastrointestinal: Negative for abdominal pain, diarrhea, nausea and vomiting.  Genitourinary: Negative for dysuria and hematuria.  Neurological: Negative for dizziness, sensory change and focal weakness.  All other systems reviewed and are negative.   Nutrition: Full liquid Tolerating Diet: yes Tolerating PT: Ambulatory   DRUG ALLERGIES:   Allergies  Allergen Reactions  . Sulfa Antibiotics Other (See Comments)    hyperkalemia  . Other Other (See Comments)    Agents that cause hyperkalemia  . Umeclidinium Other (See Comments)    Chest congestion  . Clarithromycin Hives  . Ramipril Cough  . Tiotropium Other (See Comments)    Chest congestion    VITALS:  Blood pressure (!) 153/69, pulse 62, temperature 97.7 F (36.5 C), temperature source Oral, resp. rate 20, height 5\' 11"  (1.803 m), weight (!) 140.1 kg (308 lb 13.8 oz), SpO2 97 %.  PHYSICAL EXAMINATION:   Physical Exam  GENERAL:  70 y.o.-year-old obese patient sitting up in chair in no acute distress.  EYES: Pupils equal, round, reactive to light and accommodation. No scleral icterus. Extraocular muscles intact.  HEENT: Head atraumatic, normocephalic. Oropharynx and nasopharynx clear.  NECK:  Supple, no jugular venous distention. No thyroid enlargement, no tenderness.  LUNGS: Normal breath sounds  bilaterally, no wheezing, rales, rhonchi. No use of accessory muscles of respiration.  CARDIOVASCULAR: S1, S2 normal. No murmurs, rubs, or gallops.  ABDOMEN: Soft, nontender, nondistended. Bowel sounds present. No organomegaly or mass.  EXTREMITIES: No cyanosis, clubbing or edema b/l.    NEUROLOGIC: Cranial nerves II through XII are intact. No focal Motor or sensory deficits b/l.   PSYCHIATRIC: The patient is alert and oriented x 3.  SKIN: No obvious rash, lesion, or ulcer.    LABORATORY PANEL:   CBC  Recent Labs Lab 09/03/16 0342  WBC 3.0*  HGB 12.5*  HCT 36.6*  PLT 83*   ------------------------------------------------------------------------------------------------------------------  Chemistries   Recent Labs Lab 09/03/16 0342  NA 140  K 4.5  CL 109  CO2 26  GLUCOSE 138*  BUN 30*  CREATININE 1.94*  CALCIUM 8.9  AST 24  ALT 35  ALKPHOS 148*  BILITOT 1.0   ------------------------------------------------------------------------------------------------------------------  Cardiac Enzymes No results for input(s): TROPONINI in the last 168 hours. ------------------------------------------------------------------------------------------------------------------  RADIOLOGY:  Dg Chest 1 View  Result Date: 09/03/2016 CLINICAL DATA:  Shortness of breath and chest tightness, 2-3 days duration. EXAM: CHEST 1 VIEW COMPARISON:  08/31/2016 FINDINGS: Previous median sternotomy and CABG. Pacemaker/ AICD remains in place. Chronic cardiomegaly. Mild interstitial prominence could reflect early interstitial edema. No consolidation, collapse or effusion. IMPRESSION: Cardiomegaly. CABG. Pacemaker AICD. Question early interstitial edema. Electronically Signed   By: Paulina FusiMark  Shogry M.D.   On: 09/03/2016 07:39     ASSESSMENT AND PLAN:   70 year old male with past medical history of obesity, COPD, diabetes, chronic kidney disease  stage III, history of CHF, hypertension, hyperlipidemia  presented to the hospital due to altered mental status secondary to sepsis.  1. Sepsis-patient is positive for Escherichia coli in his blood. Source is unclear. It was thought to be biliary in nature and possible choledocholithiasis. -Patient is status post cholecystectomy. Seen by gastroenterology and no plans for acute intervention. At one point didn't want to do an MRCP but the patient has a pacemaker therefore cannot have MRCP.  -Continue IV Keflex for now and will switch to oral upon discharge. Patient will likely need total of 2 weeks of therapy.  2. Escherichia coli bacteremia-source unclear. -Continue IV Keflex, will switch to something oral upon discharge. Suspected to be secondary to biliary stones. As per gastroenterology started on Actigall.  3. Acute kidney injury-secondary to hypotension and sepsis. Improved with IV fluids and as patient's hemodynamics have improved.  -creatinine close to baseline now.  4. Essential hypertension-continue hydralazine.  5. GERD-continue Protonix.  6. Diabetic neuropathy-continue Lyrica.  7. COPD-no acute exacerbation-continue Dulera, albuterol nebulizers as needed.  If renal function improving tomorrow then discharge on Oral Keflex  All the records are reviewed and case discussed with Care Management/Social Worker. Management plans discussed with the patient, family and they are in agreement.  CODE STATUS: Full code  DVT Prophylaxis: Ambulatory, Ted's & SCD's  TOTAL TIME TAKING CARE OF THIS PATIENT: 30 minutes.   POSSIBLE D/C IN 1-2 DAYS, DEPENDING ON CLINICAL CONDITION.   [place] SirenSAINANI,Amberia Bayless J M.D on 09/03/2016 at 2:36 PM  Between 7am to 6pm - Pager - [phone]  After 6pm go to [email or web address] - Social research officer, governmentpassword EPAS [email or web address]  [employer] [place] [facility]  Office  [phone]  CC: Primary care physician; CROWDER, Pete PeltJONATHAN EARL, MD

## 2016-09-04 LAB — COMPREHENSIVE METABOLIC PANEL WITH GFR
ALT: 25 U/L (ref 17–63)
AST: 25 U/L (ref 15–41)
Albumin: 3.6 g/dL (ref 3.5–5.0)
Alkaline Phosphatase: 149 U/L — ABNORMAL HIGH (ref 38–126)
Anion gap: 8 (ref 5–15)
BUN: 28 mg/dL — ABNORMAL HIGH (ref 6–20)
CO2: 23 mmol/L (ref 22–32)
Calcium: 9 mg/dL (ref 8.9–10.3)
Chloride: 107 mmol/L (ref 101–111)
Creatinine, Ser: 1.62 mg/dL — ABNORMAL HIGH (ref 0.61–1.24)
GFR calc Af Amer: 47 mL/min — ABNORMAL LOW
GFR calc non Af Amer: 41 mL/min — ABNORMAL LOW
Glucose, Bld: 145 mg/dL — ABNORMAL HIGH (ref 65–99)
Potassium: 4 mmol/L (ref 3.5–5.1)
Sodium: 138 mmol/L (ref 135–145)
Total Bilirubin: 1.1 mg/dL (ref 0.3–1.2)
Total Protein: 7.1 g/dL (ref 6.5–8.1)

## 2016-09-04 LAB — CBC
HCT: 37 % — ABNORMAL LOW (ref 40.0–52.0)
HEMOGLOBIN: 12.6 g/dL — AB (ref 13.0–18.0)
MCH: 32 pg (ref 26.0–34.0)
MCHC: 34.1 g/dL (ref 32.0–36.0)
MCV: 94 fL (ref 80.0–100.0)
PLATELETS: 104 10*3/uL — AB (ref 150–440)
RBC: 3.94 MIL/uL — AB (ref 4.40–5.90)
RDW: 14 % (ref 11.5–14.5)
WBC: 3.3 10*3/uL — AB (ref 3.8–10.6)

## 2016-09-04 LAB — GLUCOSE, CAPILLARY
GLUCOSE-CAPILLARY: 134 mg/dL — AB (ref 65–99)
GLUCOSE-CAPILLARY: 158 mg/dL — AB (ref 65–99)
GLUCOSE-CAPILLARY: 182 mg/dL — AB (ref 65–99)

## 2016-09-04 MED ORDER — CEFUROXIME AXETIL 500 MG PO TABS: 500.0000 mg | ORAL_TABLET | Freq: Two times a day (BID) | ORAL | 0 refills | Status: AC

## 2016-09-04 MED ORDER — URSODIOL 300 MG PO CAPS: 300.0000 mg | ORAL_CAPSULE | Freq: Two times a day (BID) | ORAL | 1 refills | Status: AC

## 2016-09-04 NOTE — Care Management Note (Signed)
Case Management Note  Patient Details  Name: Gracelyn NurseJerry R Witting MRN: [id number] Date of Birth: 25-May-1946  Subjective/Objective:       Mr Persley may schedule outpatient  PT by calling either his PCP or [facility] the [facility] for OP- follow up.             Action/Plan:   Expected Discharge Date:                  Expected Discharge Plan:     In-House Referral:     Discharge planning Services     Post Acute Care Choice:    Choice offered to:     DME Arranged:    DME Agency:     HH Arranged:    HH Agency:     Status of Service:     If discussed at [employer] Length of Stay Meetings, dates discussed:    Additional Comments:  Dantrell Schertzer A, RN 09/04/2016, 9:26 AM

## 2016-09-04 NOTE — Progress Notes (Signed)
DISCHARGE NOTE:   Pt given discharge instructions and prescriptions (cefUROXime and   ursodiol). Pt verbalized understanding. Pt wheeled to car by staff.

## 2016-09-05 NOTE — Discharge Summary (Signed)
[facility] - [place] at [facility]   PATIENT NAME: Brent Silva    MR#:  [id number]  DATE OF BIRTH:  09-21-1945  DATE OF ADMISSION:  08/30/2016 ADMITTING PHYSICIAN: Ihor Austin, MD  DATE OF DISCHARGE: 09/04/2016 11:08 AM  PRIMARY CARE PHYSICIAN: CROWDER, Pete Pelt, MD    ADMISSION DIAGNOSIS:  Abdominal pain [R10.9] Sepsis, due to unspecified organism (HCC) [A41.9] Acute pancreatitis, unspecified complication status, unspecified pancreatitis type [K85.90]  DISCHARGE DIAGNOSIS:  Principal Problem:   Sepsis (HCC) Active Problems:   Acute pancreatitis   SECONDARY DIAGNOSIS:   Past Medical History:  Diagnosis Date  . Arthritis   . CAD (coronary artery disease)   . CHF (congestive heart failure) (HCC)   . Chronic kidney disease   . COPD (chronic obstructive pulmonary disease) (HCC)   . Diabetes mellitus without complication (HCC)   . Hyperlipidemia   . Hypertension   . Myocardial infarction 2000  . PVD (peripheral vascular disease) [facility])     HOSPITAL COURSE:   70 year old male with past medical history of obesity, COPD, diabetes, chronic kidney disease stage III, history of CHF, hypertension, hyperlipidemia presented to the hospital due to altered mental status secondary to sepsis.  1. Sepsis-This was secondary to cholelithiasis. Patient is status post cholecystectomy but despite that patient presented with symptoms of biliary pathology and elevated LFTs and bilirubins consistent with cholelithiasis. -Patient's blood cultures were positive for Escherichia coli. Patient was treated with wide spectrum IV antibiotics which were eventually narrowed down to IV Ancef. Patient is not being discharged on oral Keflex for a total of 10 more days to finish a total of 2 weeks course of antibiotics. -Patient was seen by gastroenterology but did not recommend any intervention as patient's LFTs improved and he clinically improved with aggressive therapy with IV  antibiotics and IV fluids.  2. Escherichia coli bacteremia-source was biliary in nature and patient was treated with broad-spectrum IV antibiotics initially and then now down to IV Keflex and now being discharged on oral Keflex.  -Since the etiology was biliary in nature patient was discharged on Actigall as per gastroenterology. Patient will finish a total of 2 weeks course of antibiotics.  3. Acute kidney injury-secondary to hypotension and sepsis.  -Patient's renal function has significantly improved with IV fluids and IV antibiotics and is close to baseline. He is urinating well. His renal function can be further followed as an outpatient.  4. Essential hypertension- pt. Will continue hydralazine.  5. GERD- pt. Will continue Protonix.  6. Diabetic neuropathy- pt. Will continue Lyrica.  7. COPD-no acute exacerbation while in the hospital - pt. Will cont. His Advair.   DISCHARGE CONDITIONS:   Stable  CONSULTS OBTAINED:    DRUG ALLERGIES:   Allergies  Allergen Reactions  . Sulfa Antibiotics Other (See Comments)    hyperkalemia  . Other Other (See Comments)    Agents that cause hyperkalemia  . Umeclidinium Other (See Comments)    Chest congestion  . Clarithromycin Hives  . Ramipril Cough  . Tiotropium Other (See Comments)    Chest congestion    DISCHARGE MEDICATIONS:   Allergies as of 09/04/2016      Reactions   Sulfa Antibiotics Other (See Comments)   hyperkalemia   Other Other (See Comments)   Agents that cause hyperkalemia   Umeclidinium Other (See Comments)   Chest congestion   Clarithromycin Hives   Ramipril Cough   Tiotropium Other (See Comments)   Chest congestion  Medication List    TAKE these medications   acetaminophen 500 MG tablet Commonly known as:  TYLENOL Take 500 mg by mouth every 6 (six) hours as needed.   ADVAIR DISKUS 500-50 MCG/DOSE Aepb Generic drug:  Fluticasone-Salmeterol Inhale 1 puff into the lungs 2 (two) times  daily.   aspirin 81 MG tablet Take 81 mg by mouth daily.   atorvastatin 40 MG tablet Commonly known as:  LIPITOR Take 40 mg by mouth at bedtime.   CARDURA 2 MG tablet Generic drug:  doxazosin Take 2 mg by mouth daily.   cefUROXime 500 MG tablet Commonly known as:  CEFTIN Take 1 tablet (500 mg total) by mouth 2 (two) times daily with a meal.   glipiZIDE 10 MG tablet Commonly known as:  GLUCOTROL Take 20 mg by mouth 2 (two) times daily before a meal.   hydrALAZINE 25 MG tablet Commonly known as:  APRESOLINE Take 25 mg by mouth 3 (three) times daily.   isosorbide dinitrate 20 MG tablet Commonly known as:  ISORDIL Take 20 mg by mouth 3 (three) times daily.   LANTUS SOLOSTAR 100 UNIT/ML Solostar Pen Generic drug:  Insulin Glargine Inject 70 Units into the skin at bedtime.   loratadine 10 MG tablet Commonly known as:  CLARITIN Take 10 mg by mouth daily as needed.   losartan 25 MG tablet Commonly known as:  COZAAR Take 12.5 mg by mouth daily.   metoprolol succinate 100 MG 24 hr tablet Commonly known as:  TOPROL-XL Take 100 mg by mouth daily.   nitroGLYCERIN 0.4 MG SL tablet Commonly known as:  NITROSTAT Place 0.4 mg under the tongue every 5 (five) minutes as needed for chest pain.   nitroGLYCERIN 0.4 mg/hr patch Commonly known as:  NITRODUR - Dosed in mg/24 hr Place onto the skin.   omeprazole 20 MG capsule Commonly known as:  PRILOSEC Take 20 mg by mouth 2 (two) times daily before a meal.   Polyethylene Glycol 3350 Powd Take 1 Dose by mouth daily as needed.   pregabalin 75 MG capsule Commonly known as:  LYRICA One po QD for one week, then one po Bid for one week, then one po tid   RANEXA 1000 MG SR tablet Generic drug:  ranolazine Take 1,000 mg by mouth 2 (two) times daily.   tamsulosin 0.4 MG Caps capsule Commonly known as:  FLOMAX Take 0.4 mg by mouth daily.   torsemide 20 MG tablet Commonly known as:  DEMADEX Take 20 mg by mouth daily.    ursodiol 300 MG capsule Commonly known as:  ACTIGALL Take 1 capsule (300 mg total) by mouth 2 (two) times daily.   VENTOLIN HFA 108 (90 Base) MCG/ACT inhaler Generic drug:  albuterol Inhale 2 puffs into the lungs every 6 (six) hours as needed for wheezing.         DISCHARGE INSTRUCTIONS:   DIET:  Cardiac diet and Diabetic diet  DISCHARGE CONDITION:  Stable  ACTIVITY:  Activity as tolerated  OXYGEN:  Home Oxygen: No.   Oxygen Delivery: room air  DISCHARGE LOCATION:  home   If you experience worsening of your admission symptoms, develop shortness of breath, life threatening emergency, suicidal or homicidal thoughts you must seek medical attention immediately by calling 911 or calling your MD immediately  if symptoms less severe.  You Must read complete instructions/literature along with all the possible adverse reactions/side effects for all the Medicines you take and that have been prescribed to you. Take any new  Medicines after you have completely understood and accpet all the possible adverse reactions/side effects.   Please note  You were cared for by a hospitalist during your hospital stay. If you have any questions about your discharge medications or the care you received while you were in the hospital after you are discharged, you can call the unit and asked to speak with the hospitalist on call if the hospitalist that took care of you is not available. Once you are discharged, your primary care physician will handle any further medical issues. Please note that NO REFILLS for any discharge medications will be authorized once you are discharged, as it is imperative that you return to your primary care physician (or establish a relationship with a primary care physician if you do not have one) for your aftercare needs so that they can reassess your need for medications and monitor your lab values.     Today   No abdominal pain, N/v.  Tolerating PO well.  No fever.   LFT's improved. Renal function stable.   VITAL SIGNS:  Blood pressure (!) 147/69, pulse 70, temperature 97.7 F (36.5 C), temperature source Oral, resp. rate 20, height 5\' 11"  (1.803 m), weight (!) 140.1 kg (308 lb 13.8 oz), SpO2 94 %.  I/O:  No intake or output data in the 24 hours ending 09/05/16 1614  PHYSICAL EXAMINATION:  GENERAL:  70 y.o.-year-old obese patient sitting up in the bed in no acute distress.  EYES: Pupils equal, round, reactive to light and accommodation. No scleral icterus. Extraocular muscles intact.  HEENT: Head atraumatic, normocephalic. Oropharynx and nasopharynx clear.  NECK:  Supple, no jugular venous distention. No thyroid enlargement, no tenderness.  LUNGS: Normal breath sounds bilaterally, no wheezing, rales,rhonchi. No use of accessory muscles of respiration.  CARDIOVASCULAR: S1, S2 normal. No murmurs, rubs, or gallops.  ABDOMEN: Soft, non-tender, non-distended. Bowel sounds present. No organomegaly or mass.  EXTREMITIES: No pedal edema, cyanosis, or clubbing.  NEUROLOGIC: Cranial nerves II through XII are intact. No focal motor or sensory defecits b/l.  PSYCHIATRIC: The patient is alert and oriented x 3. Good affect.  SKIN: No obvious rash, lesion, or ulcer.   DATA REVIEW:   CBC  Recent Labs Lab 09/04/16 0355  WBC 3.3*  HGB 12.6*  HCT 37.0*  PLT 104*    Chemistries   Recent Labs Lab 09/04/16 0355  NA 138  K 4.0  CL 107  CO2 23  GLUCOSE 145*  BUN 28*  CREATININE 1.62*  CALCIUM 9.0  AST 25  ALT 25  ALKPHOS 149*  BILITOT 1.1    Cardiac Enzymes No results for input(s): TROPONINI in the last 168 hours.  Microbiology Results  Results for orders placed or performed during the hospital encounter of 08/30/16  Blood Culture (routine x 2)     Status: Abnormal   Collection Time: 08/31/16 12:16 AM  Result Value Ref Range Status   Specimen Description BLOOD RIGHT [facility]  Final   Special Requests BOTTLES DRAWN AEROBIC AND ANAEROBIC ANA11CC  AER8CC  Final   Culture  Setup Time   Final    GRAM NEGATIVE RODS IN BOTH AEROBIC AND ANAEROBIC BOTTLES CRITICAL RESULT CALLED TO, READ BACK BY AND VERIFIED WITH: CHRISTINE KATSOUDAS 08/31/16 1202 SGD Performed at [facility]    Culture ESCHERICHIA COLI (A)  Final   Report Status 09/02/2016 FINAL  Final   Organism ID, Bacteria ESCHERICHIA COLI  Final      Susceptibility   Escherichia coli -  MIC*    AMPICILLIN <=2 SENSITIVE Sensitive     CEFAZOLIN <=4 SENSITIVE Sensitive     CEFEPIME <=1 SENSITIVE Sensitive     CEFTAZIDIME <=1 SENSITIVE Sensitive     CEFTRIAXONE <=1 SENSITIVE Sensitive     CIPROFLOXACIN <=0.25 SENSITIVE Sensitive     GENTAMICIN <=1 SENSITIVE Sensitive     IMIPENEM <=0.25 SENSITIVE Sensitive     TRIMETH/SULFA <=20 SENSITIVE Sensitive     AMPICILLIN/SULBACTAM <=2 SENSITIVE Sensitive     PIP/TAZO <=4 SENSITIVE Sensitive     Extended ESBL NEGATIVE Sensitive     * ESCHERICHIA COLI  Blood Culture ID Panel (Reflexed)     Status: Abnormal   Collection Time: 08/31/16 12:16 AM  Result Value Ref Range Status   Enterococcus species NOT DETECTED NOT DETECTED Final   Listeria monocytogenes NOT DETECTED NOT DETECTED Final   Staphylococcus species NOT DETECTED NOT DETECTED Final   Staphylococcus aureus NOT DETECTED NOT DETECTED Final   Streptococcus species NOT DETECTED NOT DETECTED Final   Streptococcus agalactiae NOT DETECTED NOT DETECTED Final   Streptococcus pneumoniae NOT DETECTED NOT DETECTED Final   Streptococcus pyogenes NOT DETECTED NOT DETECTED Final   Acinetobacter baumannii NOT DETECTED NOT DETECTED Final   Enterobacteriaceae species DETECTED (A) NOT DETECTED Final    Comment: CRITICAL RESULT CALLED TO, READ BACK BY AND VERIFIED WITH: CHRISTINE KATSOUDAS 08/31/16 1202 SGD    Enterobacter cloacae complex NOT DETECTED NOT DETECTED Final   Escherichia coli DETECTED (A) NOT DETECTED Final    Comment: CRITICAL RESULT CALLED TO, READ BACK BY AND VERIFIED  WITH: CHRISTINE KATSOUDAS 08/31/16 1202 SGD    Klebsiella oxytoca NOT DETECTED NOT DETECTED Final   Klebsiella pneumoniae NOT DETECTED NOT DETECTED Final   Proteus species NOT DETECTED NOT DETECTED Final   Serratia marcescens NOT DETECTED NOT DETECTED Final   Carbapenem resistance NOT DETECTED NOT DETECTED Final   Haemophilus influenzae NOT DETECTED NOT DETECTED Final   Neisseria meningitidis NOT DETECTED NOT DETECTED Final   Pseudomonas aeruginosa NOT DETECTED NOT DETECTED Final   Candida albicans NOT DETECTED NOT DETECTED Final   Candida glabrata NOT DETECTED NOT DETECTED Final   Candida krusei NOT DETECTED NOT DETECTED Final   Candida parapsilosis NOT DETECTED NOT DETECTED Final   Candida tropicalis NOT DETECTED NOT DETECTED Final  Blood Culture (routine x 2)     Status: Abnormal   Collection Time: 08/31/16 12:17 AM  Result Value Ref Range Status   Specimen Description BLOOD LEFT AC  Final   Special Requests   Final    BOTTLES DRAWN AEROBIC AND ANAEROBIC AER11CC ANA10CC   Culture  Setup Time   Final    GRAM NEGATIVE RODS IN BOTH AEROBIC AND ANAEROBIC BOTTLES CRITICAL RESULT CALLED TO, READ BACK BY AND VERIFIED WITH: CHRISTINE KATSOUDAS 08/31/16 1202 SGD    Culture (A)  Final    ESCHERICHIA COLI SUSCEPTIBILITIES PERFORMED ON PREVIOUS CULTURE WITHIN THE LAST 5 DAYS. Performed at [facility] [place]    Report Status 09/02/2016 FINAL  Final  MRSA PCR Screening     Status: None   Collection Time: 08/31/16  5:48 AM  Result Value Ref Range Status   MRSA by PCR NEGATIVE NEGATIVE Final    Comment:        The GeneXpert MRSA Assay (FDA approved for NASAL specimens only), is one component of a comprehensive MRSA colonization surveillance program. It is not intended to diagnose MRSA infection nor to guide or monitor treatment for MRSA infections.  Urine culture     Status: None   Collection Time: 08/31/16 12:46 PM  Result Value Ref Range Status   Specimen Description URINE,  RANDOM  Final   Special Requests NONE  Final   Culture NO GROWTH Performed at [facility]   Final   Report Status 09/01/2016 FINAL  Final    RADIOLOGY:  No results found.    Management plans discussed with the patient, family and they are in agreement.  CODE STATUS:  Code Status History    Date Active Date Inactive Code Status Order ID Comments User Context   08/31/2016  5:43 AM 09/04/2016  2:13 PM Full Code [id number]  Ihor AustinPavan Pyreddy, MD Inpatient      TOTAL TIME TAKING CARE OF THIS PATIENT: 40 minutes.    [place] SirenSAINANI,Amilia Vandenbrink J M.D on 09/05/2016 at 4:14 PM  Between 7am to 6pm - Pager - [phone]  After 6pm go to [email or web address] - Social research officer, governmentpassword EPAS [facility]  [employer] [place] [facility]  Office  [phone]  CC: Primary care physician; CROWDER, Pete PeltJONATHAN EARL, MD

## 2017-05-19 IMAGING — DX DG CHEST 1V PORT
1 series · 1 of 1 positions shown · non-contrast
Comparison: 08/31/2016 at [DATE] a.m.

CLINICAL DATA: Central line placement.

EXAM:
PORTABLE CHEST 1 VIEW [DATE] p.m.

[chest ap]
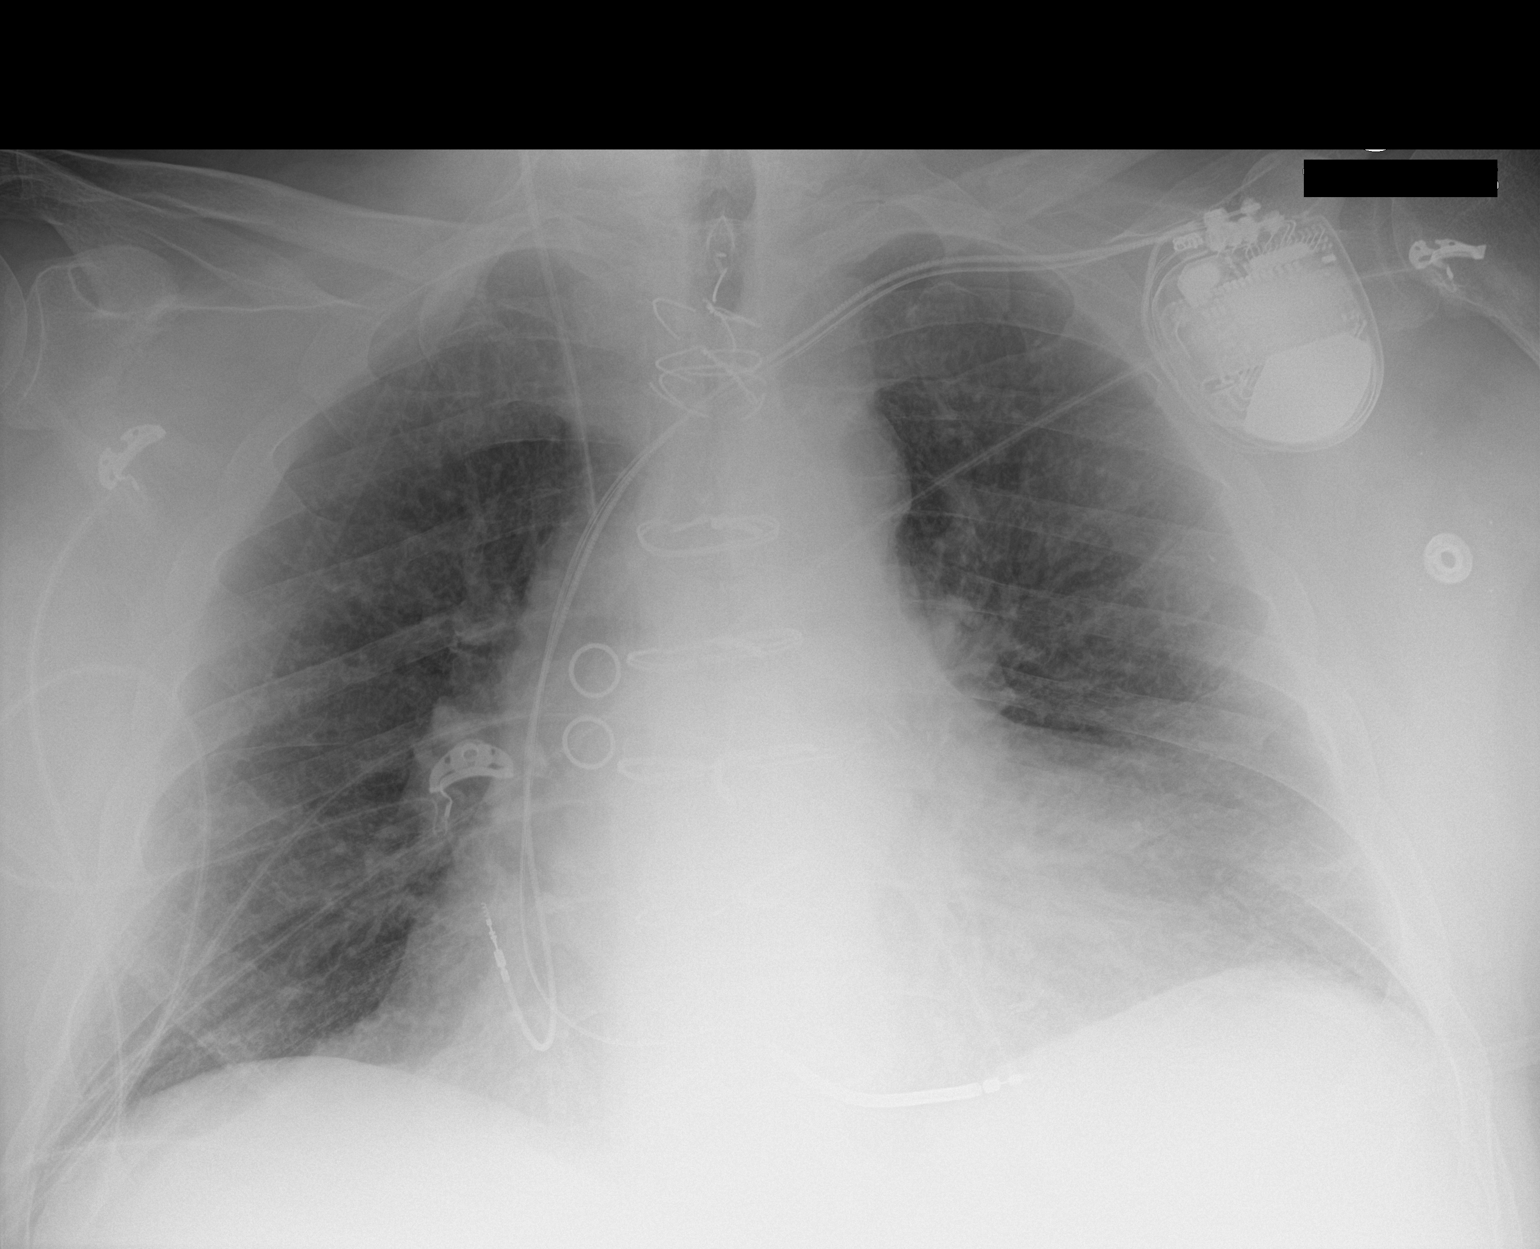

[1 of 1 positions shown; findings below may reference images not displayed]

FINDINGS: There is a new right jugular vein catheter with the tip in the
superior vena cava. No pneumothorax.

Heart size and pulmonary vascularity are normal. Lungs are clear.
AICD in place. CABG.
IMPRESSION: Central line appears in good position. No pneumothorax. No active
disease.

## 2017-05-22 IMAGING — DX DG CHEST 1V
1 series · 1 of 1 positions shown · non-contrast
Comparison: 08/31/2016

CLINICAL DATA: Shortness of breath and chest tightness, 2-3 days
duration.

EXAM:
CHEST 1 VIEW

[chest ap]
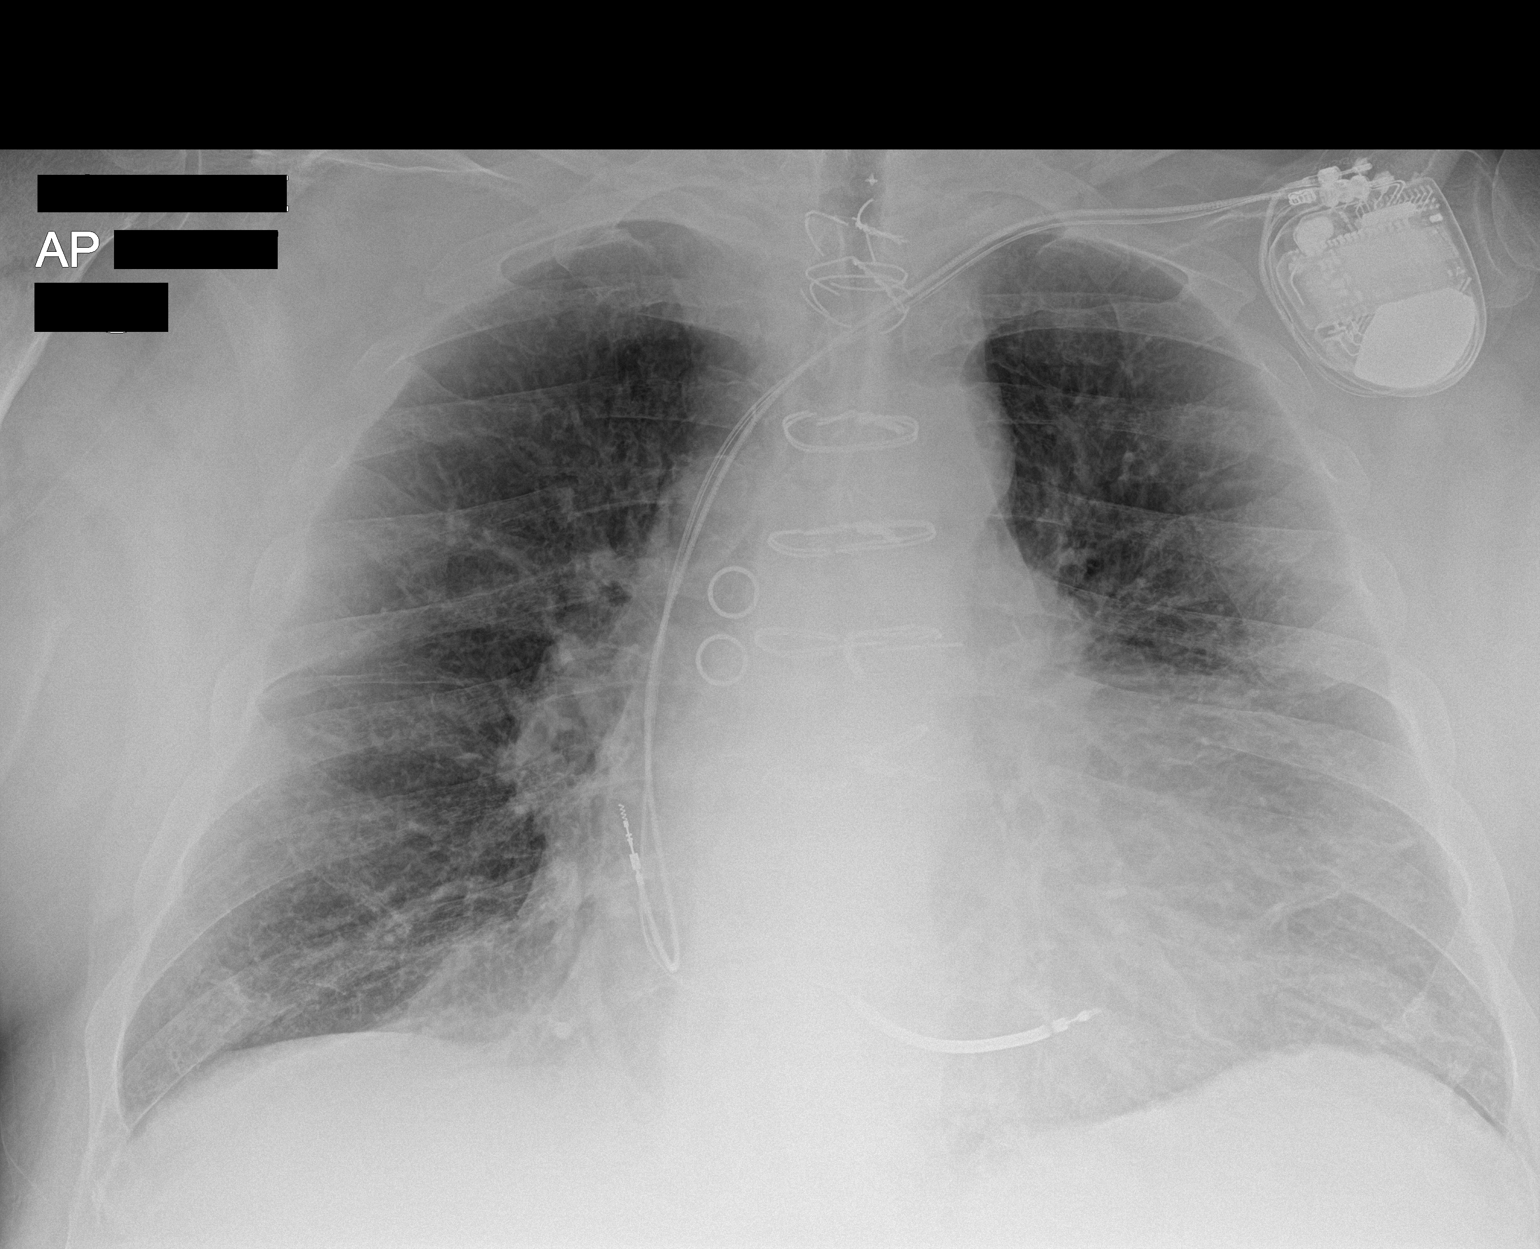

[1 of 1 positions shown; findings below may reference images not displayed]

FINDINGS: Previous median sternotomy and CABG. Pacemaker/ AICD remains in
place. Chronic cardiomegaly. Mild interstitial prominence could
reflect early interstitial edema. No consolidation, collapse or
effusion.
IMPRESSION: Cardiomegaly. CABG. Pacemaker AICD. Question early interstitial
edema.

## 2017-11-17 DEATH — deceased
